# Patient Record
Sex: Female | Born: 1980 | Race: White | Hispanic: No | Smoking: Never smoker
Health system: Southern US, Community
[De-identification: ages and names within clinical notes are randomized; demographics above are authoritative.]

## PROBLEM LIST (undated history)

## (undated) DIAGNOSIS — K219 Gastro-esophageal reflux disease without esophagitis: Secondary | ICD-10-CM

## (undated) DIAGNOSIS — G43909 Migraine, unspecified, not intractable, without status migrainosus: Secondary | ICD-10-CM

## (undated) DIAGNOSIS — F411 Generalized anxiety disorder: Secondary | ICD-10-CM

## (undated) DIAGNOSIS — N200 Calculus of kidney: Secondary | ICD-10-CM

## (undated) DIAGNOSIS — F988 Other specified behavioral and emotional disorders with onset usually occurring in childhood and adolescence: Secondary | ICD-10-CM

## (undated) DIAGNOSIS — Z87442 Personal history of urinary calculi: Secondary | ICD-10-CM

## (undated) DIAGNOSIS — D649 Anemia, unspecified: Secondary | ICD-10-CM

## (undated) HISTORY — DX: Other specified behavioral and emotional disorders with onset usually occurring in childhood and adolescence: F98.8

## (undated) HISTORY — DX: Generalized anxiety disorder: F41.1

## (undated) HISTORY — DX: Migraine, unspecified, not intractable, without status migrainosus: G43.909

## (undated) HISTORY — PX: LITHOTRIPSY: SUR834

## (undated) HISTORY — DX: Gastro-esophageal reflux disease without esophagitis: K21.9

## (undated) HISTORY — PX: WISDOM TOOTH EXTRACTION: SHX21

## (undated) HISTORY — PX: CERVICAL CERCLAGE: SHX1329

---

## 1999-04-09 ENCOUNTER — Emergency Department (HOSPITAL_COMMUNITY): Admission: EM | Admit: 1999-04-09 | Discharge: 1999-04-10 | Payer: Self-pay | Admitting: Emergency Medicine

## 2001-05-15 ENCOUNTER — Encounter: Payer: Self-pay | Admitting: Family Medicine

## 2001-05-15 ENCOUNTER — Encounter: Admission: RE | Admit: 2001-05-15 | Discharge: 2001-05-15 | Payer: Self-pay | Admitting: Family Medicine

## 2001-12-06 ENCOUNTER — Encounter: Admission: RE | Admit: 2001-12-06 | Discharge: 2001-12-25 | Payer: Self-pay | Admitting: Obstetrics and Gynecology

## 2002-10-30 ENCOUNTER — Other Ambulatory Visit: Admission: RE | Admit: 2002-10-30 | Discharge: 2002-10-30 | Payer: Self-pay | Admitting: Gynecology

## 2003-01-20 ENCOUNTER — Inpatient Hospital Stay (HOSPITAL_COMMUNITY): Admission: AD | Admit: 2003-01-20 | Discharge: 2003-01-23 | Payer: Self-pay | Admitting: Gynecology

## 2003-06-02 ENCOUNTER — Encounter (INDEPENDENT_AMBULATORY_CARE_PROVIDER_SITE_OTHER): Payer: Self-pay | Admitting: Specialist

## 2003-06-02 ENCOUNTER — Inpatient Hospital Stay (HOSPITAL_COMMUNITY): Admission: AD | Admit: 2003-06-02 | Discharge: 2003-06-05 | Payer: Self-pay | Admitting: Gynecology

## 2003-08-04 ENCOUNTER — Other Ambulatory Visit: Admission: RE | Admit: 2003-08-04 | Discharge: 2003-08-04 | Payer: Self-pay | Admitting: Gynecology

## 2004-08-04 ENCOUNTER — Other Ambulatory Visit: Admission: RE | Admit: 2004-08-04 | Discharge: 2004-08-04 | Payer: Self-pay | Admitting: Addiction Medicine

## 2005-08-05 ENCOUNTER — Other Ambulatory Visit: Admission: RE | Admit: 2005-08-05 | Discharge: 2005-08-05 | Payer: Self-pay | Admitting: Obstetrics and Gynecology

## 2006-09-20 ENCOUNTER — Other Ambulatory Visit: Admission: RE | Admit: 2006-09-20 | Discharge: 2006-09-20 | Payer: Self-pay | Admitting: Obstetrics and Gynecology

## 2008-01-30 ENCOUNTER — Other Ambulatory Visit: Admission: RE | Admit: 2008-01-30 | Discharge: 2008-01-30 | Payer: Self-pay | Admitting: Obstetrics and Gynecology

## 2008-01-30 ENCOUNTER — Encounter: Payer: Self-pay | Admitting: Women's Health

## 2008-01-30 ENCOUNTER — Ambulatory Visit: Payer: Self-pay | Admitting: Women's Health

## 2009-04-08 ENCOUNTER — Ambulatory Visit: Payer: Self-pay | Admitting: Women's Health

## 2009-04-08 ENCOUNTER — Other Ambulatory Visit: Admission: RE | Admit: 2009-04-08 | Discharge: 2009-04-08 | Payer: Self-pay | Admitting: Obstetrics and Gynecology

## 2009-04-15 ENCOUNTER — Emergency Department (HOSPITAL_COMMUNITY): Admission: EM | Admit: 2009-04-15 | Discharge: 2009-04-15 | Payer: Self-pay | Admitting: Emergency Medicine

## 2009-05-27 ENCOUNTER — Ambulatory Visit: Payer: Self-pay | Admitting: Women's Health

## 2009-06-08 ENCOUNTER — Ambulatory Visit: Payer: Self-pay | Admitting: Women's Health

## 2009-07-27 ENCOUNTER — Ambulatory Visit (HOSPITAL_COMMUNITY): Admission: RE | Admit: 2009-07-27 | Discharge: 2009-07-27 | Payer: Self-pay | Admitting: Obstetrics and Gynecology

## 2009-12-09 ENCOUNTER — Inpatient Hospital Stay (HOSPITAL_COMMUNITY): Admission: AD | Admit: 2009-12-09 | Discharge: 2009-12-10 | Payer: Self-pay | Admitting: Obstetrics and Gynecology

## 2010-01-07 ENCOUNTER — Inpatient Hospital Stay (HOSPITAL_COMMUNITY): Admission: AD | Admit: 2010-01-07 | Discharge: 2010-01-07 | Payer: Self-pay | Admitting: Obstetrics and Gynecology

## 2010-01-21 ENCOUNTER — Inpatient Hospital Stay (HOSPITAL_COMMUNITY): Admission: AD | Admit: 2010-01-21 | Discharge: 2010-01-23 | Payer: Self-pay | Admitting: Obstetrics and Gynecology
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
## 2010-05-25 LAB — CBC
HCT: 26.3 % — ABNORMAL LOW (ref 36.0–46.0)
HCT: 29 % — ABNORMAL LOW (ref 36.0–46.0)
Hemoglobin: 9.1 g/dL — ABNORMAL LOW (ref 12.0–15.0)
MCV: 73.8 fL — ABNORMAL LOW (ref 78.0–100.0)
Platelets: 288 10*3/uL (ref 150–400)
RBC: 3.57 MIL/uL — ABNORMAL LOW (ref 3.87–5.11)
RDW: 17.8 % — ABNORMAL HIGH (ref 11.5–15.5)
RDW: 17.9 % — ABNORMAL HIGH (ref 11.5–15.5)
WBC: 10.6 10*3/uL — ABNORMAL HIGH (ref 4.0–10.5)
WBC: 12 10*3/uL — ABNORMAL HIGH (ref 4.0–10.5)

## 2010-05-25 LAB — RPR: RPR Ser Ql: NONREACTIVE

## 2010-05-27 LAB — CBC
Hemoglobin: 9.3 g/dL — ABNORMAL LOW (ref 12.0–15.0)
MCH: 25.3 pg — ABNORMAL LOW (ref 26.0–34.0)
MCHC: 33 g/dL (ref 30.0–36.0)
Platelets: 241 10*3/uL (ref 150–400)

## 2010-05-27 LAB — URINE CULTURE: Colony Count: NO GROWTH

## 2010-05-27 LAB — STREP B DNA PROBE: Strep Group B Ag: NEGATIVE

## 2010-05-27 LAB — RPR: RPR Ser Ql: NONREACTIVE

## 2010-05-31 LAB — CBC
HCT: 32 % — ABNORMAL LOW (ref 36.0–46.0)
Hemoglobin: 11.1 g/dL — ABNORMAL LOW (ref 12.0–15.0)
MCHC: 34.7 g/dL (ref 30.0–36.0)
MCV: 84 fL (ref 78.0–100.0)
RBC: 3.81 MIL/uL — ABNORMAL LOW (ref 3.87–5.11)
WBC: 6 10*3/uL (ref 4.0–10.5)

## 2010-07-30 NOTE — Discharge Summary (Signed)
NAMEBRITENY, FULGHUM                        ACCOUNT NO.:  [id number]   MEDICAL RECORD NO.:  [id number]                   PATIENT TYPE:  INP   LOCATION:  9153                                 FACILITY:  [facility]   PHYSICIAN:  Timothy P. Fontaine, M.D.           DATE OF BIRTH:  April 16, 1980   DATE OF ADMISSION:  01/20/2003  DATE OF DISCHARGE:  01/23/2003                                 DISCHARGE SUMMARY   DISCHARGE DIAGNOSES:  1. Intrauterine pregnancy 21+ weeks, undelivered.  2. Incompetent cervix.  3. McDonald type cervical cerclage placement by Timothy P. Fontaine, M.D. on     January 22, 2003.   HISTORY:  This is a 30 year old female gravida 3, para 0 whose prenatal  course was complicated by progressive cervical dilatation.  The patient was  evaluated November 5 with ultrasound to complete a second trimester  screening and was found to have a cervical length of 23 mm with funneling.  On prior ultrasound January 03, 2003 found to have a cervical length of 39  mm.  She was instructed as far as low level activity and repeat ultrasound  done three days later and found to have cervical length of 13 mm with  funneling.  The patient was admitted.   HOSPITAL COURSE:  On January 20, 2003 patient was admitted at 21+ weeks for  bed rest and further evaluation.  Was begun on IV Unasyn 1.5 g and  ultrasound was ordered for approximately 24 hours after the first ultrasound  in the office.  On January 21, 2003 cervical ultrasound showed cervical  length of 3.7 without funneling, vertex, no previa, AFI normal.  Secondary  to the diagnosis of incompetent cervix patient was planned for a cerclage.  On January 22, 2003 patient without complaint, good fetal movement, no  contraction, no discharge.  The patient was counseled and it was decided to  proceed forward with a McDonald type cervical cerclage placement by Timothy  P. Fontaine, M.D. on January 22, 2003 without complications.  On January 23, 2003 patient had a quiet night without contractions or bleeding.  Abdomen and uterus soft and nontender and patient was felt at that point in  satisfactory condition to be discharged to home.   DISCHARGE INSTRUCTIONS:  The patient was discharged to home.  Was placed on  bed rest.  Ampicillin 500 mg q.i.d. for seven days.  Just check sonogram  with office visit earlier the next week.     Susa Loffler, P.A.                    Timothy P. Audie Box, M.D.    TSG/MEDQ  D:  02/17/2003  T:  02/17/2003  Job:  [id number]

## 2010-07-30 NOTE — Consult Note (Signed)
   NAME:  Cassie English, Cassie English                        ACCOUNT NO.:  [id number]   MEDICAL RECORD NO.:  [id number]                   PATIENT TYPE:  INP   LOCATION:  9160                                 [facility]   PHYSICIAN:  Timothy P. Fontaine, M.D.           DATE OF BIRTH:  18-Mar-1980   DATE OF CONSULTATION:  DATE OF DISCHARGE:                                   CONSULTATION   HISTORY:  Ultrasound:  Cervical length this morning shows a length of 3.7 cm  without funneling.  The patient's prior length was 13 mm in the office.  I  reviewed with the patient, again, the situation.  It certainly is reassuring  that her cervical length appears normal on ultrasound after bed rest.  The  question is whether once she gets up and moves around a little more, say, at  home whether her cervix would again dilate to a point where she would then  have bulging membranes and ultimately lose the pregnancy.  I reviewed with  her the situation and the whole issue as to whether a prophylactic cerclage  would be of benefit.  I reviewed what is involved with a cerclage to include  the anesthesia and the placement of the suture.  The risks of immediate  complications to include rupture of membranes, hemorrhage, damage to  surrounding tissue such as vagina, bladder, rectum was discussed as well as  the longer term issues as far as infection and subsequent loss of the  pregnancy with rupture of membranes and the risk of achieving a periviable  pregnancy and delivering a severely premature infant with long-term  sequelae.  The options of expectant management with decreased activity and  bed rest versus placing the cerclage and again maintaining decreased  activity were offered and the patient feels comfortable with proceeding with  the cerclage.  I discussed the case with my partners, Ivor Costa. Farrel Gobble, M.D.  and Gaetano Hawthorne. Lily Peer, M.D. and they both agree that cerclage placement is  probably the most appropriate  given her total situation.  The patient  understands.  Again, there is no guarantees that cerclage will ultimately  prolong her pregnancy and she accepts this.  The patient will be scheduled  tomorrow and will proceed with cerclage placement.                                               Timothy P. Audie Box, M.D.    TPF/MEDQ  D:  01/21/2003  T:  01/21/2003  Job:  [id number]

## 2010-07-30 NOTE — H&P (Signed)
NAME:  Cassie English, Cassie English                        ACCOUNT NO.:  [id number]   MEDICAL RECORD NO.:  [id number]                   PATIENT TYPE:  INP   LOCATION:  9160                                 [facility]   PHYSICIAN:  Timothy P. Fontaine, M.D.           DATE OF BIRTH:  02-10-1981   DATE OF ADMISSION:  01/20/2003  DATE OF DISCHARGE:                                HISTORY & PHYSICAL   CHIEF COMPLAINT:  Progressive cervical dilatation.   HISTORY OF PRESENT ILLNESS:  30 year old G3, P0 female at [redacted] weeks gestation  who enters with a history of progressive cervical dilatation.  The patient  was evaluated January 17, 2003 with ultrasound to complete a second  trimester screening ultrasound and was found to have a cervical length of 23  mm with funneling.  On prior ultrasound January 03, 2003 she was found to  have a cervical length of 39 mm.  She was instructed as far as low level  activities and on repeat ultrasound three days later was found to have a  cervical length of 13 mm with funneling.  The patient is admitted at this  time for bed rest and further evaluation.   PAST HISTORY:  Includes an SAB and a TAB, and is otherwise uncomplicated  without history of cervical surgeries.   PRENATAL COURSE:  Has a negative cystic fibrosis screen.  A normal MSAFP.  A  negative GC and chlamydia screen.  For remainder of her history see her  [facility].   PHYSICAL EXAMINATION:  HEENT:  Normal.  LUNGS:  Clear.  CARDIAC:  Regular rate without rubs, murmurs or gallops.  ABDOMEN:  Exam benign with uterus appropriate for 21 weeks.  Positive fetal  heart tones.  PELVIC:  External BUS, vagina normal with normal whitish discharge; KOH wet  prep done which does show yeast thought negative BP or Trichomonas. Cervix  is shortened and closed.  Strep culture done and results are pending at this  time.   ASSESSMENT AND PLAN:  30 year old G3, P0, AB-2 female with shortened cervix  at 21 weeks.   History benign as far as surgical surgery; does have TAB x1  with an SAB x1.  The situation discussed with the patient and her husband  and the issues as to the best way to manage were reviewed.  Options for  expectant management with the risk that there will be silent progression of  her cervical dilatation and ultimate loss of the pregnancy or achieved peri-  viable or severely premature stage was reviewed versus the risks of  proceeding with a cervical cerclage at this time and the risk of either  immediate loss of the pregnancy due to a complication with the cerclage or a  delayed loss associated with the cerclage secondary to infection, rupture of  membranes, or preterm contractions was all discussed with them.  A strep  culture was done today.  Will start antibiotic  coverage with Unasyn 1.5  grams; bed rest, and will repeat ultrasound tomorrow after roughly 24 hours  of bed rest.  Will reassess at that time and decide whether we want to  proceed with cerclage, if so we will rest tomorrow and proceed the day after  to achieve maximum cervical  length prior to placing the cerclage.  Will further discuss after her follow  up ultrasound and develop the plan.  Wet prep was negative with the  exception of yeast and will hold on treatment at this time.  Her GC and  chlamydia screen early in pregnancy was negative.  The patient and her  husband understand the issues and agree with the plan.                                               Timothy P. Audie Box, M.D.    TPF/MEDQ  D:  01/20/2003  T:  01/21/2003  Job:  [id number]

## 2010-07-30 NOTE — Discharge Summary (Signed)
[name], Cassie English                        ACCOUNT NO.:  [id number]   MEDICAL RECORD NO.:  [id number]                   PATIENT TYPE:  INP   LOCATION:  9111                                 [facility]ILITY:  [facility]   PHYSICIAN:  Ivor Costa. Farrel Gobble, M.D.              DATE OF BIRTH:  1980/08/05   DATE OF ADMISSION:  06/02/2003  DATE OF DISCHARGE:  06/05/2003                                 DISCHARGE SUMMARY   DISCHARGE DIAGNOSES:  1. Intrauterine pregnancy at 40 weeks delivered.  2. Short cervix status post cerclage placement with removal at 37 weeks.  3. Status post forceps-assisted vaginal delivery.  4. Asymptomatic anemia.   HISTORY:  This is a 22-years-of-age female gravida 3 para 0 with an EDC of  May 29, 2003.  Prenatal course had been complicated by the patient being  found to have a short cervix and cerclage was placed, McDonald's type, at 21  weeks and was removed at 37 weeks.  Otherwise, prenatal course was  uncomplicated.   HOSPITAL COURSE:  On June 02, 2003 the patient admitted at 40 weeks with  advanced dilatation of 4 cm and the patient was admitted, artificial rupture  of membranes performed, and was noted to be dark, not particularly meconium.  On June 03, 2003 the patient underwent a forceps-assisted vaginal delivery  secondary to fetal heart rate with minimal variability and poor scalp  stimulation.  Underwent delivery of a female, Apgars of 8 and 9, weight of 8  pounds 12 ounces.  DeLee suction was performed with 2 mL of clear amniotic  fluid.  Pediatrics was in attendance.  There was a second degree midline  episiotomy which was repaired without complications.  Postpartum the patient  remained afebrile, voiding, in stable condition.  She was discharged to home  on June 06, 2003 and given [facility] Gy[facility] postpartum instructions  and postpartum booklet.   ACCESSORY CLINICAL FINDINGS/LABORATORY DATA:  The patient is A positive,  rubella immune.  On June 04, 2003  hemoglobin was 7.6.   DISPOSITION:  The patient was discharged to home, informed to return to the  office in 6 weeks, if had any problem prior to that time to be seen in the  office.     Susa Loffler, P.A.                    Ivor Costa. Farrel Gobble, M.D.    TSG/MEDQ  D:  06/23/2003  T:  06/23/2003  Job:  [id number]

## 2010-07-30 NOTE — Op Note (Signed)
   Cassie English, Cassie English                        ACCOUNT NO.:  [id number]   MEDICAL RECORD NO.:  [id number]                   PATIENT TYPE:  INP   LOCATION:  9153                                 [facility]   PHYSICIAN:  Timothy P. Fontaine, M.D.           DATE OF BIRTH:  08/28/1980   DATE OF PROCEDURE:  01/22/2003  DATE OF DISCHARGE:                                 OPERATIVE REPORT   PREOPERATIVE DIAGNOSES:  1. Pregnancy at 21-22 weeks' gestation.  2. Incompetent cervix.   POSTOPERATIVE DIAGNOSES:  1. Pregnancy at 21-22 weeks' gestation.  2. Incompetent cervix.   PROCEDURE:  McDonald-type cervical cerclage.   SURGEON:  Timothy P. Fontaine, M.D.   ANESTHESIA:  Spinal.   ESTIMATED BLOOD LOSS:  Minimal.   COMPLICATIONS:  None.   SPECIMENS:  None.   FINDINGS:  Cervix noted to be shortened and closed.  A 5-mm Mersilene band  placed, knot at 12 o'clock.   PROCEDURE:  The patient was taken to the operating room, underwent spinal  anesthesia, was placed in the dorsal lithotomy position, received a  perineal, vaginal preparation with Betadine solution.  The bladder emptied  with in-and-out Foley catheterization.  The patient was draped in the usual  fashion.  The cervix was visualized with surgical speculums, copiously  irrigated with normal saline.  The cervix cerclage was then placed using a 5-  mm Mersilene band starting at the 12 o'clock position with circumferential  placement of the pursestring-type suture with the knot tied at 12.  The knot  was tied to achieve support without strangulating the tissue.  The upper  vagina was again copiously irrigated with saline, good hemostasis was  visualized.  The speculums removed.  The patient placed in the supine  position and sent to the recovery room in good condition having tolerated  the procedure well.                                               Timothy P. Audie Box, M.D.    TPF/MEDQ  D:  01/22/2003  T:  01/23/2003   Job:  [id number]

## 2010-11-29 IMAGING — US US OB COMP +14 WK
1 series · 14 of 28 positions shown · non-contrast
Comparison: none

OBSTETRICAL ULTRASOUND:
 This ultrasound was performed in The [HOSPITAL], and the [facility] report will be stored to [REDACTED] PACS.  This report is also available in [HOSPITAL]?s accessANYware.

[Series 1: us ob comp +14 wk · 14 of 87 slices shown]
[im 4/87]
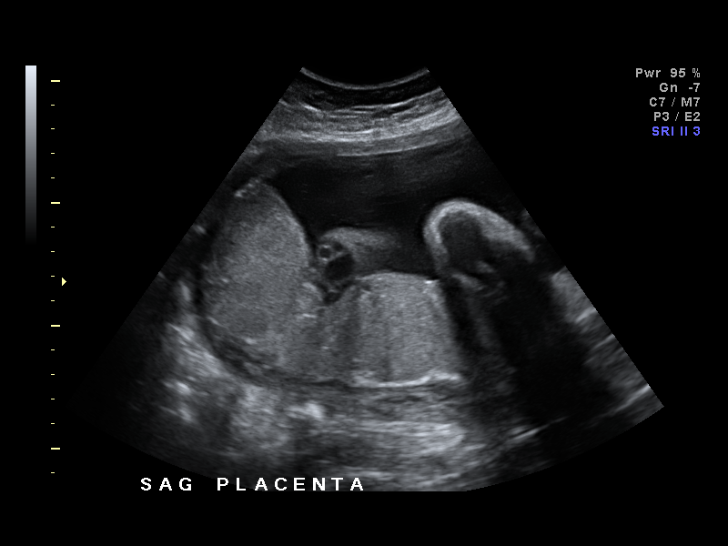
[im 10/87]
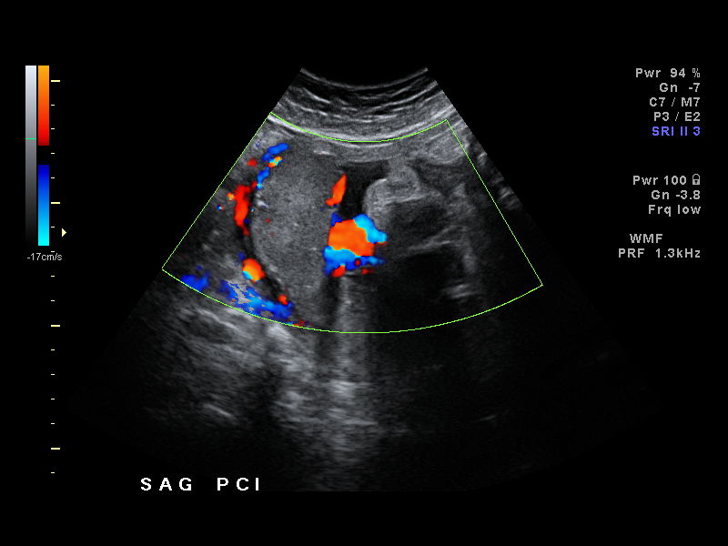
[im 16/87]
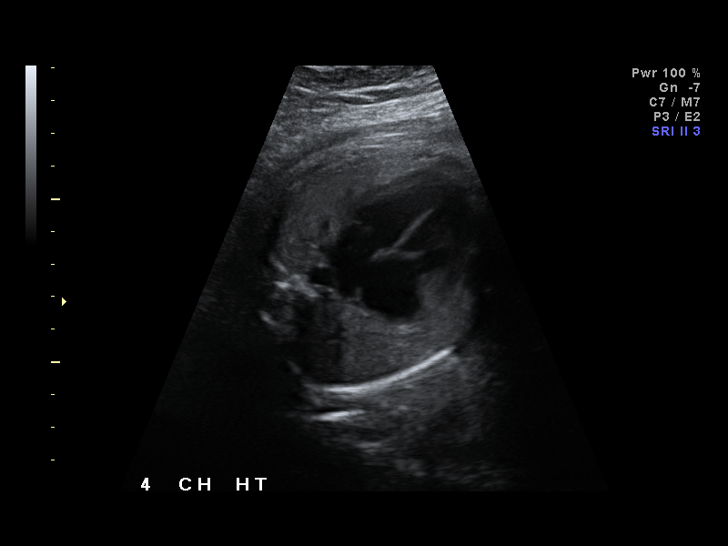
[im 23/87]
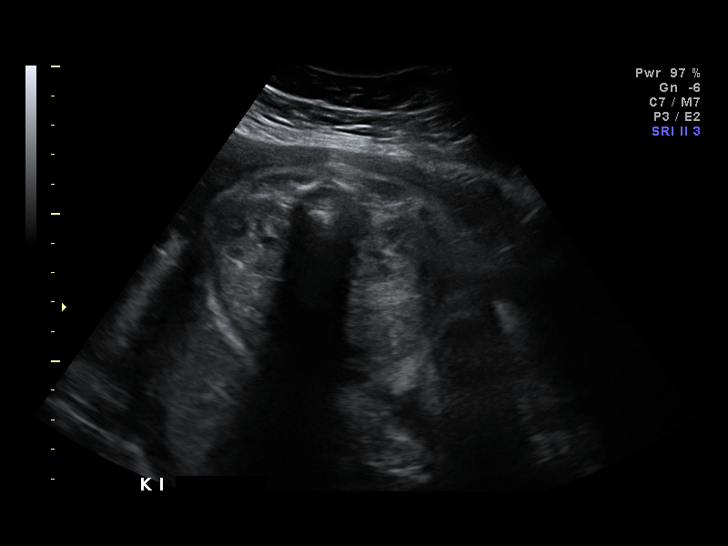
[im 29/87]
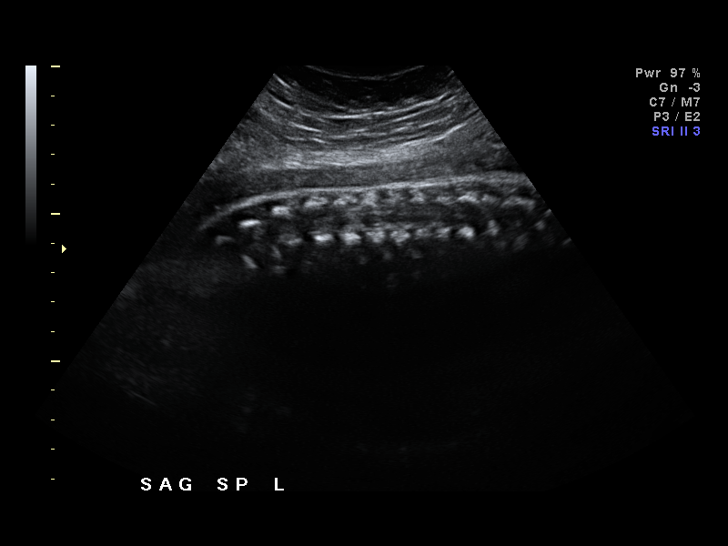
[im 36/87]
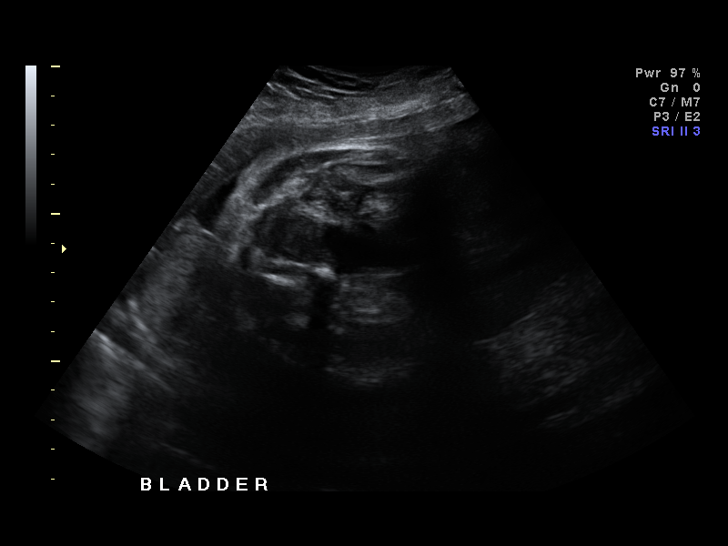
[im 42/87]
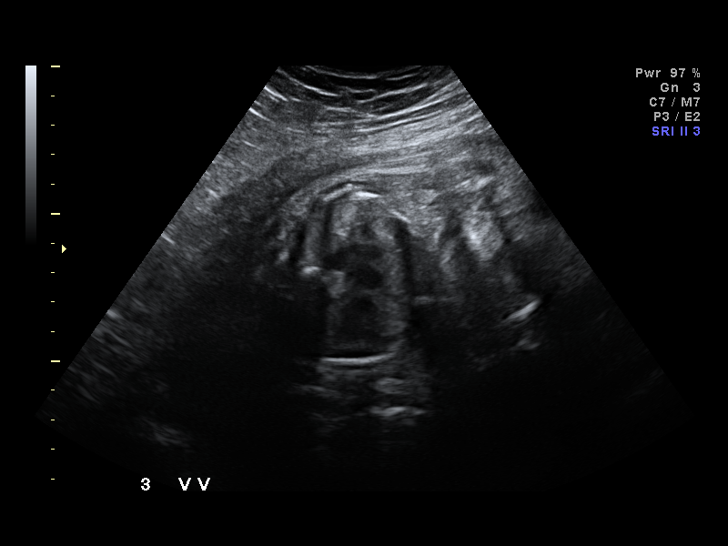
[im 48/87]
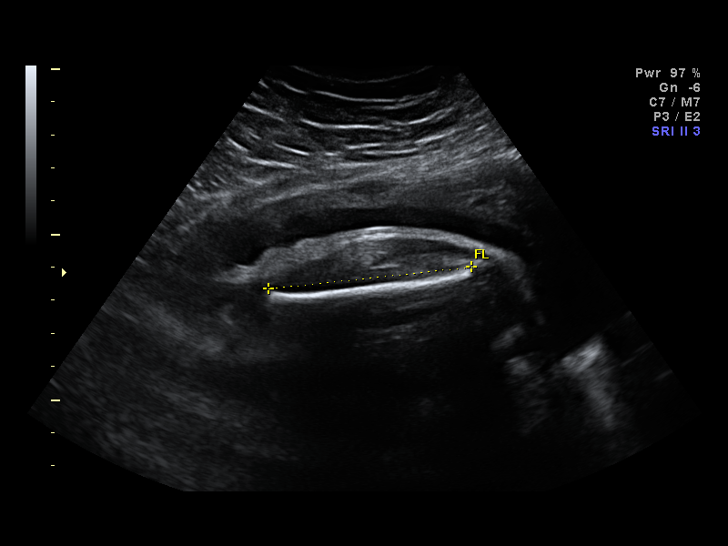
[im 55/87]
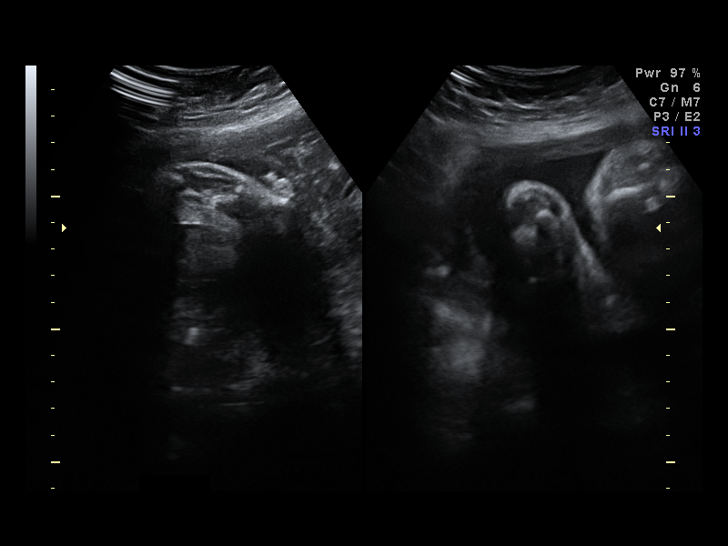
[im 61/87]
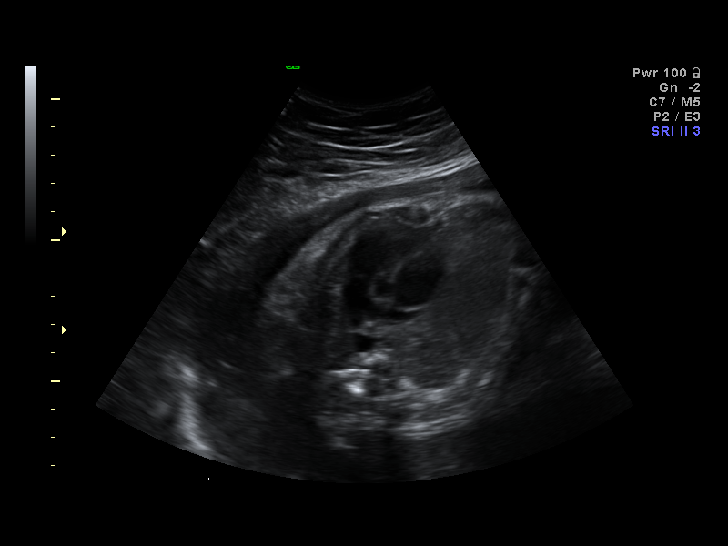
[im 67/87]
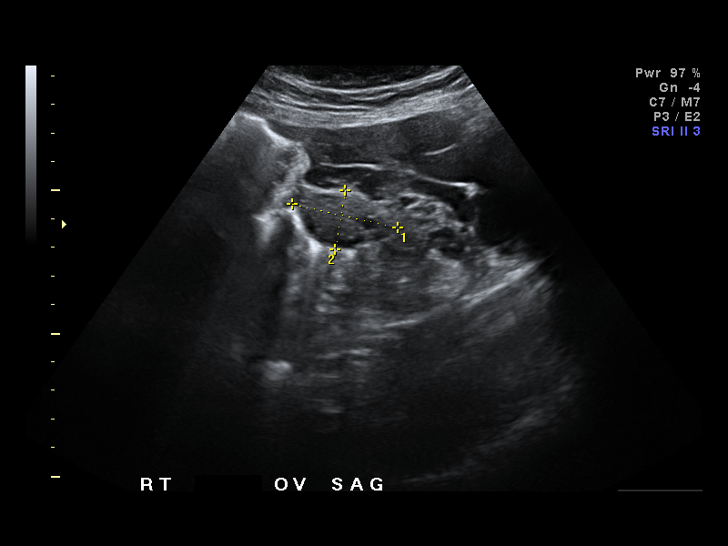
[im 74/87]
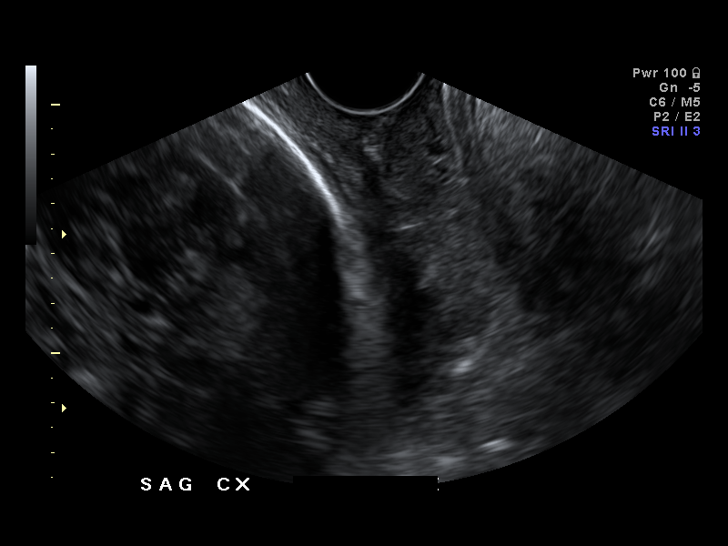
[im 80/87]
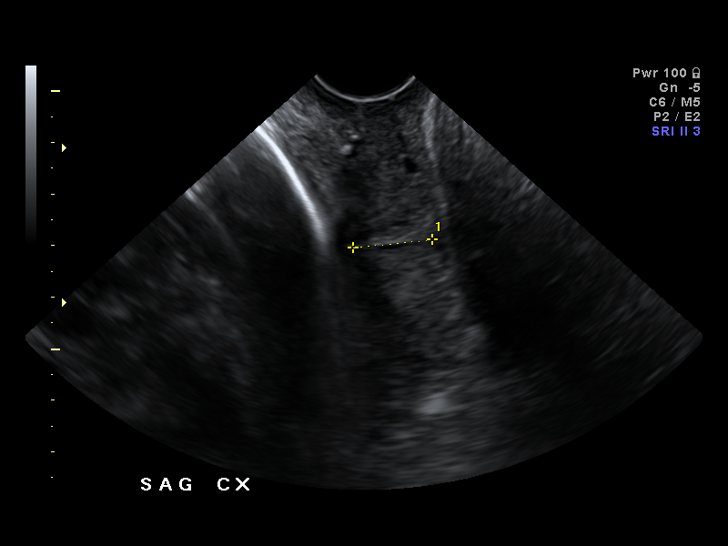
[im 87/87]
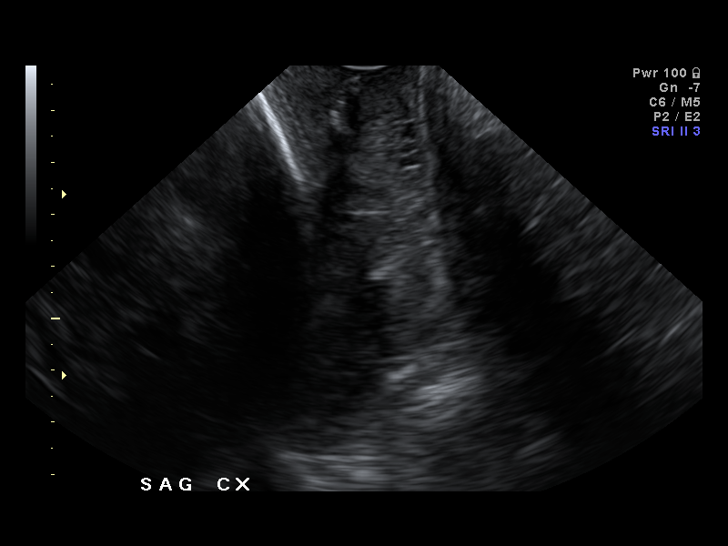

[14 of 28 positions shown; findings below may reference images not displayed]

IMPRESSION: [facility] has also been faxed to the ordering physician.

## 2012-05-14 ENCOUNTER — Encounter: Payer: Self-pay | Admitting: Women's Health

## 2012-05-23 ENCOUNTER — Other Ambulatory Visit (HOSPITAL_COMMUNITY)
Admission: RE | Admit: 2012-05-23 | Discharge: 2012-05-23 | Disposition: A | Discharge status: Home / Self Care | Payer: BC Managed Care – PPO | Source: Ambulatory Visit | Attending: Obstetrics and Gynecology | Admitting: Obstetrics and Gynecology

## 2012-05-23 ENCOUNTER — Encounter: Payer: Self-pay | Admitting: Women's Health

## 2012-05-23 ENCOUNTER — Ambulatory Visit (INDEPENDENT_AMBULATORY_CARE_PROVIDER_SITE_OTHER): Payer: BC Managed Care – PPO | Admitting: Women's Health

## 2012-05-23 VITALS — BP 110/66 | Ht 64.0 in | Wt 140.0 lb

## 2012-05-23 DIAGNOSIS — Z01419 Encounter for gynecological examination (general) (routine) without abnormal findings: Secondary | ICD-10-CM

## 2012-05-23 DIAGNOSIS — F411 Generalized anxiety disorder: Secondary | ICD-10-CM

## 2012-05-23 DIAGNOSIS — G43909 Migraine, unspecified, not intractable, without status migrainosus: Secondary | ICD-10-CM | POA: Insufficient documentation

## 2012-05-23 DIAGNOSIS — Z833 Family history of diabetes mellitus: Secondary | ICD-10-CM

## 2012-05-23 DIAGNOSIS — N898 Other specified noninflammatory disorders of vagina: Secondary | ICD-10-CM

## 2012-05-23 LAB — CBC WITH DIFFERENTIAL/PLATELET
Basophils Absolute: 0 10*3/uL (ref 0.0–0.1)
Eosinophils Absolute: 0 10*3/uL (ref 0.0–0.7)
Eosinophils Relative: 1 % (ref 0–5)
HCT: 38.5 % (ref 36.0–46.0)
Lymphocytes Relative: 31 % (ref 12–46)
Lymphs Abs: 1.2 10*3/uL (ref 0.7–4.0)
MCH: 26.5 pg (ref 26.0–34.0)
MCV: 81.1 fL (ref 78.0–100.0)
Monocytes Absolute: 0.4 10*3/uL (ref 0.1–1.0)
RDW: 13.8 % (ref 11.5–15.5)
WBC: 3.8 10*3/uL — ABNORMAL LOW (ref 4.0–10.5)

## 2012-05-23 LAB — WET PREP FOR TRICH, YEAST, CLUE
Trich, Wet Prep: NONE SEEN
Yeast Wet Prep HPF POC: NONE SEEN

## 2012-05-23 LAB — LIPID PANEL
HDL: 57 mg/dL (ref >39)
LDL Cholesterol: 106 mg/dL — ABNORMAL HIGH (ref 0–99)
Triglycerides: 59 mg/dL (ref <150)
VLDL: 12 mg/dL (ref 0–40)

## 2012-05-23 MED ORDER — FLUOXETINE HCL 10 MG PO CAPS: 10.0000 mg | ORAL_CAPSULE | Freq: Every day | ORAL | Status: DC

## 2012-05-23 MED ORDER — METRONIDAZOLE 0.75 % VA GEL: VAGINAL | Status: DC

## 2012-05-23 NOTE — Patient Instructions (Signed)

## 2012-05-23 NOTE — Progress Notes (Addendum)
Cordelia Bessinger Ferris 02-05-1981 [id number]    History:    The patient presents for annual exam.  Regular monthly cycles/condoms. Since last office visit delivered at [facility]. History of normal Paps. Anxiety taking Prozac 10 mg daily with good relief of symptoms.   Past medical history, past surgical history, family history and social history were all reviewed and documented in the EPIC chart. 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 Father diabetes   ROS:  A  ROS was performed and pertinent positives and negatives are included in the history.  Exam:  Filed Vitals:   05/23/12 0803  BP: 110/66    General appearance:  Normal Head/Neck:  Normal, without cervical or supraclavicular adenopathy. Thyroid:  Symmetrical, normal in size, without palpable masses or nodularity. Respiratory  Effort:  Normal  Auscultation:  Clear without wheezing or rhonchi Cardiovascular  Auscultation:  Regular rate, without rubs, murmurs or gallops  Edema/varicosities:  Not grossly evident Abdominal  Soft,nontender, without masses, guarding or rebound.  Liver/spleen:  No organomegaly noted  Hernia:  None appreciated  Skin  Inspection:  Grossly normal  Palpation:  Grossly normal Neurologic/psychiatric  Orientation:  Normal with appropriate conversation.  Mood/affect:  Normal  Genitourinary    Breasts: Examined lying and sitting.     Right: Without masses, retractions, discharge or axillary adenopathy.     Left: Without masses, retractions, discharge or axillary adenopathy.   Inguinal/mons:  Normal without inguinal adenopathy  External genitalia:  Normal  BUS/Urethra/Skene's glands:  Normal  Bladder:  Normal  Vagina:  Wet prep positive for amines, clues, many bacteria  Cervix:  Normal  Uterus:   normal in size, shape and contour.  Midline and mobile  Adnexa/parametria:     Rt: Without masses or tenderness.   Lt: Without masses or  tenderness.  Anus and perineum: Normal  Digital rectal exam: Normal sphincter tone without palpated masses or tenderness  Assessment/Plan:  32 y.o. M. WF G3 P2 for annual exam with complaint of discharge with occasional odor.  Bacteria vaginosis Anxiety-stable on Prozac Contraception counseling  Plan: MetroGel vaginal cream 1 applicator at bedtime x5, prescription, proper use, alcohol precautions reviewed. Prozac 10 mg by mouth daily prescription, proper use given and reviewed low-dose states is effective will continue. Denies need for counseling at this time. Contraception options reviewed, would like to try Mirena IUD, information given reviewed slight risk for perforation, hemorrhage infection. Will check coverage have Dr. Brayton Layman to place with next menstrual cycle. SBE's, regular exercise, calcium rich diet, vitamin D 1000 daily encouraged. CBC, glucose, lipid panel, UA, Pap. Pap normal 2009, new screening guidelines reviewed.    Harrington Challenger [facility], 1:33 PM 05/23/2012

## 2012-05-24 LAB — URINALYSIS W MICROSCOPIC + REFLEX CULTURE
Bacteria, UA: NONE SEEN
Bilirubin Urine: NEGATIVE
Casts: NONE SEEN
Glucose, UA: NEGATIVE mg/dL
Hgb urine dipstick: NEGATIVE
Ketones, ur: NEGATIVE mg/dL
Protein, ur: NEGATIVE mg/dL
pH: 6 (ref 5.0–8.0)

## 2012-05-25 LAB — URINE CULTURE: Colony Count: NO GROWTH

## 2012-05-28 ENCOUNTER — Other Ambulatory Visit: Payer: Self-pay | Admitting: Gynecology

## 2012-05-28 ENCOUNTER — Telehealth: Payer: Self-pay | Admitting: Gynecology

## 2012-05-28 DIAGNOSIS — Z3049 Encounter for surveillance of other contraceptives: Secondary | ICD-10-CM

## 2012-05-28 MED ORDER — LEVONORGESTREL 20 MCG/24HR IU IUD: INTRAUTERINE_SYSTEM | Freq: Once | INTRAUTERINE | Status: DC

## 2012-05-28 NOTE — Telephone Encounter (Signed)
Pt was informed today that her [facility] insurance covers the Mirena & insertion at 100%,no copay. I also reminded her that she has a preexisting clause on her policy until 10/02/12. Seeing as this is just for contraception it not a medical problem she decided to proceed and is on cycle now. We will call Tues 05/29/12 to schedule/WL

## 2012-06-01 ENCOUNTER — Ambulatory Visit: Payer: Self-pay | Admitting: Gynecology

## 2012-06-20 ENCOUNTER — Encounter: Payer: Self-pay | Admitting: Gynecology

## 2012-06-20 ENCOUNTER — Ambulatory Visit (INDEPENDENT_AMBULATORY_CARE_PROVIDER_SITE_OTHER): Payer: BC Managed Care – PPO | Admitting: Gynecology

## 2012-06-20 VITALS — BP 124/80

## 2012-06-20 DIAGNOSIS — N949 Unspecified condition associated with female genital organs and menstrual cycle: Secondary | ICD-10-CM

## 2012-06-20 DIAGNOSIS — A499 Bacterial infection, unspecified: Secondary | ICD-10-CM

## 2012-06-20 DIAGNOSIS — N898 Other specified noninflammatory disorders of vagina: Secondary | ICD-10-CM

## 2012-06-20 DIAGNOSIS — N760 Acute vaginitis: Secondary | ICD-10-CM

## 2012-06-20 MED ORDER — METRONIDAZOLE 500 MG PO TABS: 500.0000 mg | ORAL_TABLET | Freq: Two times a day (BID) | ORAL | Status: DC

## 2012-06-20 NOTE — Progress Notes (Signed)
Patient presented to the office today for placement of the Mirena IUD. She had her gynecological examination 05/23/2012 and was treated for bacterial vaginosis. She's currently on her menses but stated she was having some discharge with odor. Patient previously been provided with literature information the Mirena IUD.  Exam: Bartholin urethra Skene was within normal limits Vagina: Menstrual blood with some clear fluid was noted Cervix: As described above: Bimanual exam: Uterus anteverted normal size shape and consistency Adnexa: No palpable masses or tenderness Rectal exam not done  Wet prep: Amine positive, moderate clue cell, few WBC, too numerous to count bacteria  Assessment for/plan: Patient will be treated for bacterial vaginosis with Flagyl 500 mg one by mouth twice a day for 5 days and then return to the office to place a Mirena IUD

## 2012-06-20 NOTE — Patient Instructions (Signed)
Bacterial Vaginosis Bacterial vaginosis (BV) is a vaginal infection where the normal balance of bacteria in the vagina is disrupted. The normal balance is then replaced by an overgrowth of certain bacteria. There are several different kinds of bacteria that can cause BV. BV is the most common vaginal infection in women of childbearing age. CAUSES   The cause of BV is not fully understood. BV develops when there is an increase or imbalance of harmful bacteria.  Some activities or behaviors can upset the normal balance of bacteria in the vagina and put women at increased risk including:  Having a new sex partner or multiple sex partners.  Douching.  Using an intrauterine device (IUD) for contraception.  It is not clear what role sexual activity plays in the development of BV. However, women that have never had sexual intercourse are rarely infected with BV. Women do not get BV from toilet seats, bedding, swimming pools or from touching objects around them.  SYMPTOMS   Grey vaginal discharge.  A fish-like odor with discharge, especially after sexual intercourse.  Itching or burning of the vagina and vulva.  Burning or pain with urination.  Some women have no signs or symptoms at all. DIAGNOSIS  Your caregiver must examine the vagina for signs of BV. Your caregiver will perform lab tests and look at the sample of vaginal fluid through a microscope. They will look for bacteria and abnormal cells (clue cells), a pH test higher than 4.5, and a positive amine test all associated with BV.  RISKS AND COMPLICATIONS   Pelvic inflammatory disease (PID).  Infections following gynecology surgery.  Developing HIV.  Developing herpes virus. TREATMENT  Sometimes BV will clear up without treatment. However, all women with symptoms of BV should be treated to avoid complications, especially if gynecology surgery is planned. Female partners generally do not need to be treated. However, BV may spread  between female sex partners so treatment is helpful in preventing a recurrence of BV.   BV may be treated with antibiotics. The antibiotics come in either pill or vaginal cream forms. Either can be used with nonpregnant or pregnant women, but the recommended dosages differ. These antibiotics are not harmful to the baby.  BV can recur after treatment. If this happens, a second round of antibiotics will often be prescribed.  Treatment is important for pregnant women. If not treated, BV can cause a premature delivery, especially for a pregnant woman who had a premature birth in the past. All pregnant women who have symptoms of BV should be checked and treated.  For chronic reoccurrence of BV, treatment with a type of prescribed gel vaginally twice a week is helpful. HOME CARE INSTRUCTIONS   Finish all medication as directed by your caregiver.  Do not have sex until treatment is completed.  Tell your sexual partner that you have a vaginal infection. They should see their caregiver and be treated if they have problems, such as a mild rash or itching.  Practice safe sex. Use condoms. Only have 1 sex partner. PREVENTION  Basic prevention steps can help reduce the risk of upsetting the natural balance of bacteria in the vagina and developing BV:  Do not have sexual intercourse (be abstinent).  Do not douche.  Use all of the medicine prescribed for treatment of BV, even if the signs and symptoms go away.  Tell your sex partner if you have BV. That way, they can be treated, if needed, to prevent reoccurrence. SEEK MEDICAL CARE IF:     Your symptoms are not improving after 3 days of treatment.  You have increased discharge, pain, or fever. MAKE SURE YOU:   Understand these instructions.  Will watch your condition.  Will get help right away if you are not doing well or get worse. FOR MORE INFORMATION  Division of STD Prevention (DSTDP), Centers for Disease Control and Prevention:  www.cdc.gov/std American [employer] Association (ASHA): www.ashastd.org  Document Released: 02/28/2005 Document Revised: 05/23/2011 Document Reviewed: 08/21/2008 Exit[employer] Patient Information 2013 [employer].  

## 2012-06-25 ENCOUNTER — Encounter: Payer: Self-pay | Admitting: Gynecology

## 2012-06-25 ENCOUNTER — Ambulatory Visit (INDEPENDENT_AMBULATORY_CARE_PROVIDER_SITE_OTHER): Payer: BC Managed Care – PPO | Admitting: Gynecology

## 2012-06-25 VITALS — BP 120/78

## 2012-06-25 DIAGNOSIS — Z30430 Encounter for insertion of intrauterine contraceptive device: Secondary | ICD-10-CM

## 2012-06-25 NOTE — Patient Instructions (Addendum)
Intrauterine Device Information  An intrauterine device (IUD) is inserted into your uterus and prevents pregnancy. There are 2 types of IUDs available:  · Copper IUD. This type of IUD is wrapped in copper wire and is placed inside the uterus. Copper makes the uterus and fallopian tubes produce a fluid that kills sperm. The copper IUD can stay in place for 10 years.  · Hormone IUD. This type of IUD contains the hormone progestin (synthetic progesterone). The hormone thickens the cervical mucus and prevents sperm from entering the uterus, and it also thins the uterine lining to prevent implantation of a fertilized egg. The hormone can weaken or kill the sperm that get into the uterus. The hormone IUD can stay in place for 5 years.  Your caregiver will make sure you are a good candidate for a contraceptive IUD. Discuss with your caregiver the possible side effects.  ADVANTAGES  · It is highly effective, reversible, long-acting, and low maintenance.  · There are no estrogen-related side effects.  · An IUD can be used when breastfeeding.  · It is not associated with weight gain.  · It works immediately after insertion.  · The copper IUD does not interfere with your female hormones.  · The progesterone IUD can make heavy menstrual periods lighter.  · The progesterone IUD can be used for 5 years.  · The copper IUD can be used for 10 years.  DISADVANTAGES  · The progesterone IUD can be associated with irregular bleeding patterns.  · The copper IUD can make your menstrual flow heavier and more painful.  · You may experience cramping and vaginal bleeding after insertion.  Document Released: 02/02/2004 Document Revised: 05/23/2011 Document Reviewed: 07/03/2010  ExitCare® Patient Information ©2013 [employer].

## 2012-06-25 NOTE — Progress Notes (Signed)
IUD procedure note       Patient presented to the office today for placement of Mirena IUD. The patient had previously been provided with literature information on this method of contraception. The risks benefits and pros and cons were discussed and all her questions were answered. She is fully aware that this form of contraception is 99% effective and is good for 5 years.  Pelvic exam: Bartholin urethra Skene glands: Within normal limits Vagina: No lesions or discharge Cervix: No lesions or discharge Uterus: retroverted position Adnexa: No masses or tenderness Rectal exam: Not done  The cervix was cleansed with Betadine solution. A single-tooth tenaculum was placed on the anterior cervical lip. The uterus sounded to 7-1/2 centimeter. The IUD was shown to the patient and inserted in a sterile fashion. The IUD string was trimmed. The single-tooth tenaculum was removed. Patient was instructed to return back to the office in one month for follow up.     Lot #[id number]

## 2012-06-26 ENCOUNTER — Encounter: Payer: Self-pay | Admitting: Gynecology

## 2012-06-26 ENCOUNTER — Ambulatory Visit: Payer: Self-pay | Admitting: Gynecology

## 2012-07-25 ENCOUNTER — Ambulatory Visit: Payer: BC Managed Care – PPO | Admitting: Gynecology

## 2012-08-02 ENCOUNTER — Ambulatory Visit: Payer: BC Managed Care – PPO | Admitting: Gynecology

## 2012-08-21 ENCOUNTER — Ambulatory Visit: Payer: BC Managed Care – PPO | Admitting: Gynecology

## 2013-08-07 ENCOUNTER — Other Ambulatory Visit: Payer: Self-pay | Admitting: Women's Health

## 2014-01-10 ENCOUNTER — Ambulatory Visit (INDEPENDENT_AMBULATORY_CARE_PROVIDER_SITE_OTHER): Payer: BC Managed Care – PPO | Admitting: Women's Health

## 2014-01-10 ENCOUNTER — Encounter: Payer: Self-pay | Admitting: Women's Health

## 2014-01-10 VITALS — BP 120/84 | Ht 64.0 in | Wt 176.0 lb

## 2014-01-10 DIAGNOSIS — R350 Frequency of micturition: Secondary | ICD-10-CM

## 2014-01-10 DIAGNOSIS — E282 Polycystic ovarian syndrome: Secondary | ICD-10-CM

## 2014-01-10 MED ORDER — METFORMIN HCL 500 MG PO TABS: 500.0000 mg | ORAL_TABLET | Freq: Two times a day (BID) | ORAL | Status: DC

## 2014-01-10 NOTE — Patient Instructions (Signed)
Polycystic Ovarian Syndrome  Polycystic ovarian syndrome (PCOS) is a common hormonal disorder among women of reproductive age. Most women with PCOS grow many small cysts on their ovaries. PCOS can cause problems with your periods and make it difficult to get pregnant. It can also cause an increased risk of miscarriage with pregnancy. If left untreated, PCOS can lead to serious health problems, such as diabetes and heart disease.  CAUSES  The cause of PCOS is not fully understood, but genetics may be a factor.  SIGNS AND SYMPTOMS    Infrequent or no menstrual periods.    Inability to get pregnant (infertility) because of not ovulating.    Increased growth of hair on the face, chest, stomach, back, thumbs, thighs, or toes.    Acne, oily skin, or dandruff.    Pelvic pain.    Weight gain or obesity, usually carrying extra weight around the waist.    Type 2 diabetes.    High cholesterol.    High blood pressure.    Female-pattern baldness or thinning hair.    Patches of thickened and dark brown or black skin on the neck, arms, breasts, or thighs.    Tiny excess flaps of skin (skin tags) in the armpits or neck area.    Excessive snoring and having breathing stop at times while asleep (sleep apnea).    Deepening of the voice.    Gestational diabetes when pregnant.   DIAGNOSIS   There is no single test to diagnose PCOS.    Your health care provider will:    Take a medical history.    Perform a pelvic exam.    Have ultrasonography done.    Check your female and female hormone levels.    Measure glucose or sugar levels in the blood.    Do other blood tests.    If you are producing too many female hormones, your health care provider will make sure it is from PCOS. At the physical exam, your health care provider will want to evaluate the areas of increased hair growth. Try to allow natural hair growth for a few days before the visit.    During a pelvic exam, the ovaries may be  enlarged or swollen because of the increased number of small cysts. This can be seen more easily by using vaginal ultrasonography or screening to examine the ovaries and lining of the uterus (endometrium) for cysts. The uterine lining may become thicker if you have not been having a regular period.   TREATMENT   Because there is no cure for PCOS, it needs to be managed to prevent problems. Treatments are based on your symptoms. Treatment is also based on whether you want to have a baby or whether you need contraception.   Treatment may include:    Progesterone hormone to start a menstrual period.    Birth control pills to make you have regular menstrual periods.    Medicines to make you ovulate, if you want to get pregnant.    Medicines to control your insulin.    Medicine to control your blood pressure.    Medicine and diet to control your high cholesterol and triglycerides in your blood.   Medicine to reduce excessive hair growth.   Surgery, making small holes in the ovary, to decrease the amount of female hormone production. This is done through a long, lighted tube (laparoscope) placed into the pelvis through a tiny incision in the lower abdomen.   HOME CARE INSTRUCTIONS   Only   take over-the-counter or prescription medicine as directed by your health care provider.   Pay attention to the foods you eat and your activity levels. This can help reduce the effects of PCOS.   Keep your weight under control.   Eat foods that are low in carbohydrate and high in fiber.   Exercise regularly.  SEEK MEDICAL CARE IF:   Your symptoms do not get better with medicine.   You have new symptoms.  Document Released: 06/24/2004 Document Revised: 12/19/2012 Document Reviewed: 08/16/2012  ExitCare Patient Information 2015 [employer]. This information is not intended to replace advice given to you by your health care provider. Make sure you discuss any questions you have with your health care provider.

## 2014-01-10 NOTE — Progress Notes (Signed)
Presents with three month history of increase acne on face, hair on abdomen, and 35lb weight gain. Weight 140 05/2012, weight now 176. Diagnosed >10 years ago with PCOS when trying to conceive first child.  Was put on Metformin at that time and did well. 2014 Mirena IUD placed, light spotting occasionally. Also complains of increased thirst and urinary frequency over past week. Denies dysuria/hematuria, vaginal discharge/odor.  Saw Dr. Cherly Hensenousins a few months ago with TV US and reports there were no signs of PCOS on ovaries at this time. Reports home and work life good.  Exam: Well appearing. Mild acne,  truncal obesity. Tearful when talking about weight and changes. UA: Unable to produce urine sample.   PCOS Truncal Obesity  Plan: Metformin 500 mg PO once daily for several weeks, and then increase to 500mg  BID. TSH and blood glucose pending. Counseled to call office if symptoms don't improve. Reviewed low carb diet and health eating habits, Weight Watchers encouraged.

## 2014-01-11 LAB — GLUCOSE, RANDOM: Glucose, Bld: 78 mg/dL (ref 70–99)

## 2014-01-11 LAB — TSH: TSH: 1.134 u[IU]/mL (ref 0.350–4.500)

## 2014-01-13 ENCOUNTER — Encounter: Payer: Self-pay | Admitting: Women's Health

## 2014-05-06 ENCOUNTER — Other Ambulatory Visit: Payer: Self-pay | Admitting: Women's Health

## 2014-05-21 ENCOUNTER — Other Ambulatory Visit: Payer: Self-pay | Admitting: Women's Health

## 2014-05-29 ENCOUNTER — Other Ambulatory Visit (HOSPITAL_COMMUNITY)
Admission: RE | Admit: 2014-05-29 | Discharge: 2014-05-29 | Disposition: A | Discharge status: Home / Self Care | Payer: BLUE CROSS/BLUE SHIELD | Source: Ambulatory Visit | Attending: Women's Health | Admitting: Women's Health

## 2014-05-29 ENCOUNTER — Ambulatory Visit (INDEPENDENT_AMBULATORY_CARE_PROVIDER_SITE_OTHER): Payer: BLUE CROSS/BLUE SHIELD | Admitting: Women's Health

## 2014-05-29 ENCOUNTER — Encounter: Payer: Self-pay | Admitting: Women's Health

## 2014-05-29 VITALS — BP 115/80 | Ht 64.0 in | Wt 157.0 lb

## 2014-05-29 DIAGNOSIS — A499 Bacterial infection, unspecified: Secondary | ICD-10-CM | POA: Diagnosis not present

## 2014-05-29 DIAGNOSIS — Z13220 Encounter for screening for lipoid disorders: Secondary | ICD-10-CM

## 2014-05-29 DIAGNOSIS — Z1151 Encounter for screening for human papillomavirus (HPV): Secondary | ICD-10-CM | POA: Diagnosis present

## 2014-05-29 DIAGNOSIS — F329 Major depressive disorder, single episode, unspecified: Secondary | ICD-10-CM | POA: Diagnosis not present

## 2014-05-29 DIAGNOSIS — Z01419 Encounter for gynecological examination (general) (routine) without abnormal findings: Secondary | ICD-10-CM

## 2014-05-29 DIAGNOSIS — Z833 Family history of diabetes mellitus: Secondary | ICD-10-CM | POA: Diagnosis not present

## 2014-05-29 DIAGNOSIS — Z975 Presence of (intrauterine) contraceptive device: Secondary | ICD-10-CM | POA: Insufficient documentation

## 2014-05-29 DIAGNOSIS — B9689 Other specified bacterial agents as the cause of diseases classified elsewhere: Secondary | ICD-10-CM

## 2014-05-29 DIAGNOSIS — F32A Depression, unspecified: Secondary | ICD-10-CM

## 2014-05-29 DIAGNOSIS — R350 Frequency of micturition: Secondary | ICD-10-CM | POA: Diagnosis not present

## 2014-05-29 DIAGNOSIS — N760 Acute vaginitis: Secondary | ICD-10-CM | POA: Diagnosis not present

## 2014-05-29 DIAGNOSIS — N898 Other specified noninflammatory disorders of vagina: Secondary | ICD-10-CM

## 2014-05-29 LAB — URINALYSIS W MICROSCOPIC + REFLEX CULTURE
Crystals: NONE SEEN
GLUCOSE, UA: NEGATIVE mg/dL
Nitrite: NEGATIVE
Urobilinogen, UA: 1 mg/dL (ref 0.0–1.0)
pH: 5 (ref 5.0–8.0)

## 2014-05-29 LAB — CBC WITH DIFFERENTIAL/PLATELET
Basophils Absolute: 0 10*3/uL (ref 0.0–0.1)
Basophils Relative: 0 % (ref 0–1)
EOS ABS: 0.1 10*3/uL (ref 0.0–0.7)
Eosinophils Relative: 1 % (ref 0–5)
HCT: 41.8 % (ref 36.0–46.0)
Hemoglobin: 13.5 g/dL (ref 12.0–15.0)
LYMPHS PCT: 23 % (ref 12–46)
Lymphs Abs: 1.2 10*3/uL (ref 0.7–4.0)
MCH: 26.8 pg (ref 26.0–34.0)
MCHC: 32.3 g/dL (ref 30.0–36.0)
MCV: 83.1 fL (ref 78.0–100.0)
MPV: 8.7 fL (ref 8.6–12.4)
Monocytes Absolute: 0.7 10*3/uL (ref 0.1–1.0)
Monocytes Relative: 13 % — ABNORMAL HIGH (ref 3–12)
NEUTROS ABS: 3.2 10*3/uL (ref 1.7–7.7)
Neutrophils Relative %: 63 % (ref 43–77)
Platelets: 269 10*3/uL (ref 150–400)
RBC: 5.03 MIL/uL (ref 3.87–5.11)
RDW: 15.2 % (ref 11.5–15.5)
WBC: 5.1 10*3/uL (ref 4.0–10.5)

## 2014-05-29 LAB — LIPID PANEL
CHOL/HDL RATIO: 4.4 ratio
Cholesterol: 201 mg/dL — ABNORMAL HIGH (ref 0–200)
HDL: 46 mg/dL (ref >=46)
LDL Cholesterol: 133 mg/dL — ABNORMAL HIGH (ref 0–99)
Triglycerides: 109 mg/dL (ref <150)
VLDL: 22 mg/dL (ref 0–40)

## 2014-05-29 LAB — WET PREP FOR TRICH, YEAST, CLUE
Trich, Wet Prep: NONE SEEN
YEAST WET PREP: NONE SEEN

## 2014-05-29 LAB — GLUCOSE, RANDOM: Glucose, Bld: 85 mg/dL (ref 70–99)

## 2014-05-29 MED ORDER — METRONIDAZOLE 0.75 % VA GEL: VAGINAL | Status: DC

## 2014-05-29 MED ORDER — FLUOXETINE HCL 20 MG PO TABS: 20.0000 mg | ORAL_TABLET | Freq: Every day | ORAL | Status: DC

## 2014-05-29 NOTE — Progress Notes (Signed)
Cassie PeoplesHeather N English 06/19/1980 [id number]    History:    Presents for annual exam.  Rare bleeding on Mirena IUD placed 06/2012. Normal Pap history. Prozac daily, PMS type syptoms. Currently separated from husband denies infidelity. Tearful most of exam.  Past medical history, past surgical history, family history and social history were all reviewed and documented in the EPIC chart. 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 Cassie English 9, Cassie English 2. Cassie English diabetes.   ROS:  A ROS was performed and pertinent positives and negatives are included.  Exam:  Filed Vitals:   05/29/14 1021  BP: 115/80    General appearance:  Normal Thyroid:  Symmetrical, normal in size, without palpable masses or nodularity. Respiratory  Auscultation:  Clear without wheezing or rhonchi Cardiovascular  Auscultation:  Regular rate, without rubs, murmurs or gallops  Edema/varicosities:  Not grossly evident Abdominal  Soft,nontender, without masses, guarding or rebound.  Liver/spleen:  No organomegaly noted  Hernia:  None appreciated  Skin  Inspection:  Grossly normal   Breasts: Examined lying and sitting.     Right: Without masses, retractions, discharge or axillary adenopathy.     Left: Without masses, retractions, discharge or axillary adenopathy. Gentitourinary   Inguinal/mons:  Normal without inguinal adenopathy  External genitalia:  Normal  BUS/Urethra/Skene's glands:  Normal  Vagina:  Moderate white discharge wet prep positive for amines, clues, TNTC bacteria  Cervix:  Normal IUD strings visible  Uterus:   normal in size, shape and contour.  Midline and mobile  Adnexa/parametria:     Rt: Without masses or tenderness.   Lt: Without masses or tenderness.  Anus and perineum: Normal  Digital rectal exam: Normal sphincter tone without palpated masses or tenderness  Assessment/Plan:  34 y.o. M WF G4 P2 for annual exam with complaint of urinary frequency and vaginal discharge.    Situational stress/marital  problems Rare spotting Mirena IUD 06/2012 Bacteria vaginosis  Plan: Strongly encouraged counseling, Cassie PandyJulie English's name and number given, instructed to schedule. SBE's, exercise, calcium rich diet, MVI daily encouraged. MetroGel vaginal cream 1 applicator at bedtime 5, alcohol precautions reviewed, instructed to call if no relief of discharge. CBC, glucose, lipid panel, UA, GC/Chlamydia, Pap with HR HPV typing, new screening guidelines reviewed. Denies need for STD screen.  UA, 3-6 WBCs, 7-10 RBCs, many bacteria, small leukocytes - urine culture pending.   Cassie ChallengerYOUNG,Cassie English Cassie English, 1:12 PM 05/29/2014

## 2014-05-29 NOTE — Patient Instructions (Addendum)
Health Maintenance Adopting a healthy lifestyle and getting preventive care can go a long way to promote health and wellness. Talk with your health care provider about what schedule of regular examinations is right for you. This is a good chance for you to check in with your provider about disease prevention and staying healthy. In between checkups, there are plenty of things you can do on your own. Experts have done a lot of research about which lifestyle changes and preventive measures are most likely to keep you healthy. Ask your health care provider for more information. WEIGHT AND DIET  Eat a healthy diet  Be sure to include plenty of vegetables, fruits, low-fat dairy products, and lean protein.  Do not eat a lot of foods high in solid fats, added sugars, or salt.  Get regular exercise. This is one of the most important things you can do for your health.  Most adults should exercise for at least 150 minutes each week. The exercise should increase your heart rate and make you sweat (moderate-intensity exercise).  Most adults should also do strengthening exercises at least twice a week. This is in addition to the moderate-intensity exercise.  Maintain a healthy weight  Body mass index (BMI) is a measurement that can be used to identify possible weight problems. It estimates body fat based on height and weight. Your health care provider can help determine your BMI and help you achieve or maintain a healthy weight.  For females 25 years of age and older:   A BMI below 18.5 is considered underweight.  A BMI of 18.5 to 24.9 is normal.  A BMI of 25 to 29.9 is considered overweight.  A BMI of 30 and above is considered obese.  Watch levels of cholesterol and blood lipids  You should start having your blood tested for lipids and cholesterol at 36 years of age, then have this test every 5 years.  You may need to have your cholesterol levels checked more often if:  Your lipid or  cholesterol levels are high.  You are older than 36 years of age.  You are at high risk for heart disease.  CANCER SCREENING   Lung Cancer  Lung cancer screening is recommended for adults 97-92 years old who are at high risk for lung cancer because of a history of smoking.  A yearly low-dose CT scan of the lungs is recommended for people who:  Currently smoke.  Have quit within the past 15 years.  Have at least a 30-pack-year history of smoking. A pack year is smoking an average of one pack of cigarettes a day for 1 year.  Yearly screening should continue until it has been 15 years since you quit.  Yearly screening should stop if you develop a health problem that would prevent you from having lung cancer treatment.  Breast Cancer  Practice breast self-awareness. This means understanding how your breasts normally appear and feel.  It also means doing regular breast self-exams. Let your health care provider know about any changes, no matter how small.  If you are in your 20s or 30s, you should have a clinical breast exam (CBE) by a health care provider every 1-3 years as part of a regular health exam.  If you are 76 or older, have a CBE every year. Also consider having a breast X-ray (mammogram) every year.  If you have a family history of breast cancer, talk to your health care provider about genetic screening.  If you are  at high risk for breast cancer, talk to your health care provider about having an MRI and a mammogram every year.  Breast cancer gene (BRCA) assessment is recommended for women who have family members with BRCA-related cancers. BRCA-related cancers include:  Breast.  Ovarian.  Tubal.  Peritoneal cancers.  Results of the assessment will determine the need for genetic counseling and BRCA1 and BRCA2 testing. Cervical Cancer Routine pelvic examinations to screen for cervical cancer are no longer recommended for nonpregnant women who are considered low  risk for cancer of the pelvic organs (ovaries, uterus, and vagina) and who do not have symptoms. A pelvic examination may be necessary if you have symptoms including those associated with pelvic infections. Ask your health care provider if a screening pelvic exam is right for you.   The Pap test is the screening test for cervical cancer for women who are considered at risk.  If you had a hysterectomy for a problem that was not cancer or a condition that could lead to cancer, then you no longer need Pap tests.  If you are older than 65 years, and you have had normal Pap tests for the past 10 years, you no longer need to have Pap tests.  If you have had past treatment for cervical cancer or a condition that could lead to cancer, you need Pap tests and screening for cancer for at least 20 years after your treatment.  If you no longer get a Pap test, assess your risk factors if they change (such as having a new sexual partner). This can affect whether you should start being screened again.  Some women have medical problems that increase their chance of getting cervical cancer. If this is the case for you, your health care provider may recommend more frequent screening and Pap tests.  The human papillomavirus (HPV) test is another test that may be used for cervical cancer screening. The HPV test looks for the virus that can cause cell changes in the cervix. The cells collected during the Pap test can be tested for HPV.  The HPV test can be used to screen women 30 years of age and older. Getting tested for HPV can extend the interval between normal Pap tests from three to five years.  An HPV test also should be used to screen women of any age who have unclear Pap test results.  After 36 years of age, women should have HPV testing as often as Pap tests.  Colorectal Cancer  This type of cancer can be detected and often prevented.  Routine colorectal cancer screening usually begins at 36 years of  age and continues through 36 years of age.  Your health care provider may recommend screening at an earlier age if you have risk factors for colon cancer.  Your health care provider may also recommend using home test kits to check for hidden blood in the stool.  A small camera at the end of a tube can be used to examine your colon directly (sigmoidoscopy or colonoscopy). This is done to check for the earliest forms of colorectal cancer.  Routine screening usually begins at age 50.  Direct examination of the colon should be repeated every 5-10 years through 36 years of age. However, you may need to be screened more often if early forms of precancerous polyps or small growths are found. Skin Cancer  Check your skin from head to toe regularly.  Tell your health care provider about any new moles or changes in   moles, especially if there is a change in a mole's shape or color.  Also tell your health care provider if you have a mole that is larger than the size of a pencil eraser.  Always use sunscreen. Apply sunscreen liberally and repeatedly throughout the day.  Protect yourself by wearing long sleeves, pants, a wide-brimmed hat, and sunglasses whenever you are outside. HEART DISEASE, DIABETES, AND HIGH BLOOD PRESSURE   Have your blood pressure checked at least every 1-2 years. High blood pressure causes heart disease and increases the risk of stroke.  If you are between 75 years and 42 years old, ask your health care provider if you should take aspirin to prevent strokes.  Have regular diabetes screenings. This involves taking a blood sample to check your fasting blood sugar level.  If you are at a normal weight and have a low risk for diabetes, have this test once every three years after 36 years of age.  If you are overweight and have a high risk for diabetes, consider being tested at a younger age or more often. PREVENTING INFECTION  Hepatitis B  If you have a higher risk for  hepatitis B, you should be screened for this virus. You are considered at high risk for hepatitis B if:  You were born in a country where hepatitis B is common. Ask your health care provider which countries are considered high risk.  Your parents were born in a high-risk country, and you have not been immunized against hepatitis B (hepatitis B vaccine).  You have HIV or AIDS.  You use needles to inject street drugs.  You live with someone who has hepatitis B.  You have had sex with someone who has hepatitis B.  You get hemodialysis treatment.  You take certain medicines for conditions, including cancer, organ transplantation, and autoimmune conditions. Hepatitis C  Blood testing is recommended for:  Everyone born from [birth year] through 1967.  Anyone with known risk factors for hepatitis C. Sexually transmitted infections (STIs)  You should be screened for sexually transmitted infections (STIs) including gonorrhea and chlamydia if:  You are sexually active and are younger than 36 years of age.  You are older than 36 years of age and your health care provider tells you that you are at risk for this type of infection.  Your sexual activity has changed since you were last screened and you are at an increased risk for chlamydia or gonorrhea. Ask your health care provider if you are at risk.  If you do not have HIV, but are at risk, it may be recommended that you take a prescription medicine daily to prevent HIV infection. This is called pre-exposure prophylaxis (PrEP). You are considered at risk if:  You are sexually active and do not regularly use condoms or know the HIV status of your partner(s).  You take drugs by injection.  You are sexually active with a partner who has HIV. Talk with your health care provider about whether you are at high risk of being infected with HIV. If you choose to begin PrEP, you should first be tested for HIV. You should then be tested every 3 months for  as long as you are taking PrEP.  PREGNANCY   If you are premenopausal and you may become pregnant, ask your health care provider about preconception counseling.  If you may become pregnant, take 400 to 800 micrograms (mcg) of folic acid every day.  If you want to prevent pregnancy, talk to your  health care provider about birth control (contraception). OSTEOPOROSIS AND MENOPAUSE   Osteoporosis is a disease in which the bones lose minerals and strength with aging. This can result in serious bone fractures. Your risk for osteoporosis can be identified using a bone density scan.  If you are 65 years of age or older, or if you are at risk for osteoporosis and fractures, ask your health care provider if you should be screened.  Ask your health care provider whether you should take a calcium or vitamin D supplement to lower your risk for osteoporosis.  Menopause may have certain physical symptoms and risks.  Hormone replacement therapy may reduce some of these symptoms and risks. Talk to your health care provider about whether hormone replacement therapy is right for you.  HOME CARE INSTRUCTIONS   Schedule regular health, dental, and eye exams.  Stay current with your immunizations.   Do not use any tobacco products including cigarettes, chewing tobacco, or electronic cigarettes.  If you are pregnant, do not drink alcohol.  If you are breastfeeding, limit how much and how often you drink alcohol.  Limit alcohol intake to no more than 1 drink per day for nonpregnant women. One drink equals 12 ounces of beer, 5 ounces of wine, or 1 ounces of hard liquor.  Do not use street drugs.  Do not share needles.  Ask your health care provider for help if you need support or information about quitting drugs.  Tell your health care provider if you often feel depressed.  Tell your health care provider if you have ever been abused or do not feel safe at home. Document Released: 09/13/2010  Document Revised: 07/15/2013 Document Reviewed: 01/30/2013 ExitCare Patient Information 2015 [employer]. This information is not intended to replace advice given to you by your health care provider. Make sure you discuss any questions you have with your health care provider. Bacterial Vaginosis Bacterial vaginosis is an infection of the vagina. It happens when too many of certain germs (bacteria) grow in the vagina. HOME CARE  Take your medicine as told by your doctor.  Finish your medicine even if you start to feel better.  Do not have sex until you finish your medicine and are better.  Tell your sex partner that you have an infection. They should see their doctor for treatment.  Practice safe sex. Use condoms. Have only one sex partner. GET HELP IF:  You are not getting better after 3 days of treatment.  You have more grey fluid (discharge) coming from your vagina than before.  You have more pain than before.  You have a fever. MAKE SURE YOU:   Understand these instructions.  Will watch your condition.  Will get help right away if you are not doing well or get worse. Document Released: 12/08/2007 Document Revised: 12/19/2012 Document Reviewed: 10/10/2012 Exit[employer] Patient Information 2015 [employer]. This information is not intended to replace advice given to you by your health care provider. Make sure you discuss any questions you have with your health care provider.  

## 2014-05-30 LAB — GC/CHLAMYDIA PROBE AMP
CT Probe RNA: NEGATIVE
GC PROBE AMP APTIMA: NEGATIVE

## 2014-05-30 LAB — URINE CULTURE
Colony Count: NO GROWTH
Organism ID, Bacteria: NO GROWTH

## 2014-05-30 LAB — CYTOLOGY - PAP

## 2014-06-18 ENCOUNTER — Telehealth: Payer: Self-pay | Admitting: *Deleted

## 2014-06-18 MED ORDER — FLUCONAZOLE 150 MG PO TABS: 150.0000 mg | ORAL_TABLET | Freq: Once | ORAL | Status: DC

## 2014-06-18 NOTE — Telephone Encounter (Signed)
Rx sent, left on voicemail Rx sent and OV with no relief

## 2014-06-18 NOTE — Telephone Encounter (Signed)
Pt called c/o yeast infection, c/o vaginal itching and white discharge. Please advise

## 2014-06-18 NOTE — Telephone Encounter (Signed)
Please E scribe Diflucan 150, office visit if no relief.

## 2014-10-30 ENCOUNTER — Ambulatory Visit (INDEPENDENT_AMBULATORY_CARE_PROVIDER_SITE_OTHER): Payer: BLUE CROSS/BLUE SHIELD | Admitting: Women's Health

## 2014-10-30 ENCOUNTER — Encounter: Payer: Self-pay | Admitting: Women's Health

## 2014-10-30 DIAGNOSIS — Z30432 Encounter for removal of intrauterine contraceptive device: Secondary | ICD-10-CM

## 2014-10-30 DIAGNOSIS — Z30019 Encounter for initial prescription of contraceptives, unspecified: Secondary | ICD-10-CM

## 2014-10-30 DIAGNOSIS — N760 Acute vaginitis: Secondary | ICD-10-CM

## 2014-10-30 DIAGNOSIS — B9689 Other specified bacterial agents as the cause of diseases classified elsewhere: Secondary | ICD-10-CM

## 2014-10-30 DIAGNOSIS — Z975 Presence of (intrauterine) contraceptive device: Secondary | ICD-10-CM | POA: Diagnosis not present

## 2014-10-30 DIAGNOSIS — A499 Bacterial infection, unspecified: Secondary | ICD-10-CM

## 2014-10-30 LAB — URINALYSIS W MICROSCOPIC + REFLEX CULTURE
BILIRUBIN URINE: NEGATIVE
Casts: NONE SEEN [LPF]
GLUCOSE, UA: NEGATIVE
Ketones, ur: NEGATIVE
Nitrite: NEGATIVE
PH: 7 (ref 5.0–8.0)
Protein, ur: NEGATIVE
Specific Gravity, Urine: 1.02 (ref 1.001–1.035)
Yeast: NONE SEEN [HPF]

## 2014-10-30 LAB — WET PREP FOR TRICH, YEAST, CLUE
Clue Cells Wet Prep HPF POC: NONE SEEN
Trich, Wet Prep: NONE SEEN

## 2014-10-30 MED ORDER — DESOGESTREL-ETHINYL ESTRADIOL 0.15-0.02/0.01 MG (21/5) PO TABS: 1.0000 | ORAL_TABLET | Freq: Every day | ORAL | Status: DC

## 2014-10-30 MED ORDER — METRONIDAZOLE 500 MG PO TABS: 500.0000 mg | ORAL_TABLET | Freq: Two times a day (BID) | ORAL | Status: DC

## 2014-10-30 MED ORDER — FLUCONAZOLE 150 MG PO TABS: 150.0000 mg | ORAL_TABLET | Freq: Once | ORAL | Status: DC

## 2014-10-30 NOTE — Patient Instructions (Signed)
Bacterial Vaginosis Bacterial vaginosis is a vaginal infection that occurs when the normal balance of bacteria in the vagina is disrupted. It results from an overgrowth of certain bacteria. This is the most common vaginal infection in women of childbearing age. Treatment is important to prevent complications, especially in pregnant women, as it can cause a premature delivery. CAUSES  Bacterial vaginosis is caused by an increase in harmful bacteria that are normally present in smaller amounts in the vagina. Several different kinds of bacteria can cause bacterial vaginosis. However, the reason that the condition develops is not fully understood. RISK FACTORS Certain activities or behaviors can put you at an increased risk of developing bacterial vaginosis, including:  Having a new sex partner or multiple sex partners.  Douching.  Using an intrauterine device (IUD) for contraception. Women do not get bacterial vaginosis from toilet seats, bedding, swimming pools, or contact with objects around them. SIGNS AND SYMPTOMS  Some women with bacterial vaginosis have no signs or symptoms. Common symptoms include:  Grey vaginal discharge.  A fishlike odor with discharge, especially after sexual intercourse.  Itching or burning of the vagina and vulva.  Burning or pain with urination. DIAGNOSIS  Your health care provider will take a medical history and examine the vagina for signs of bacterial vaginosis. A sample of vaginal fluid may be taken. Your health care provider will look at this sample under a microscope to check for bacteria and abnormal cells. A vaginal pH test may also be done.  TREATMENT  Bacterial vaginosis may be treated with antibiotic medicines. These may be given in the form of a pill or a vaginal cream. A second round of antibiotics may be prescribed if the condition comes back after treatment.  HOME CARE INSTRUCTIONS   Only take over-the-counter or prescription medicines as  directed by your health care provider.  If antibiotic medicine was prescribed, take it as directed. Make sure you finish it even if you start to feel better.  Do not have sex until treatment is completed.  Tell all sexual partners that you have a vaginal infection. They should see their health care provider and be treated if they have problems, such as a mild rash or itching.  Practice safe sex by using condoms and only having one sex partner. SEEK MEDICAL CARE IF:   Your symptoms are not improving after 3 days of treatment.  You have increased discharge or pain.  You have a fever. MAKE SURE YOU:   Understand these instructions.  Will watch your condition.  Will get help right away if you are not doing well or get worse. FOR MORE INFORMATION  Centers for Disease Control and Prevention, Division of STD Prevention: www.cdc.gov/std American Sexual Health Association (ASHA): www.ashastd.org  Document Released: 02/28/2005 Document Revised: 12/19/2012 Document Reviewed: 10/10/2012 Exit[employer] Patient Information 2015 [employer]. This information is not intended to replace advice given to you by your health care provider. Make sure you discuss any questions you have with your health care provider.  

## 2014-10-30 NOTE — Addendum Note (Signed)
Addended by: Aura Camps on: 10/30/2014 03:01 PM   Modules accepted: Orders

## 2014-10-30 NOTE — Progress Notes (Signed)
Patient ID: Tor Netters, female   DOB: November 29, 1980, 34 y.o.   MRN: [id number] Presents with complaint of increased discharge with odor, irritation and some itching. Has not been sexually active for 1 year. Negative STD screen 05/2014. Some urinary frequency without pain or burning. Denies abdominal pain or fever. Mirena IUD placed 06/2012 but has had spotting most days since, would like to try other type of contraception. History of migraines without aura and PCO S.  Exam: Appears well. External genitalia within normal limits, speculum exam moderate amount of a watery bloody discharge noted, wet prep positive for amines, clues, TNTC bacteria. IUD strings visualized grasped with ring forcep removed intact shown to patient and discarded. UA: 2+ blood, +2 leukocytes, 0-5 WBCs, 0 did 2 RBCs, few bacteria.  Bacteria vaginosis Contraception management  Plan: Flagyl 500 twice daily for 7 days #14, alcohol precautions reviewed. Contraception/cycle reviewed will try Mircette prescription, proper use given and reviewed slight risk for blood clots and strokes, start today, instructed to call if continued spotting or discharge.  Condoms encouraged if sexually active.

## 2014-10-31 LAB — URINE CULTURE
Colony Count: NO GROWTH
Organism ID, Bacteria: NO GROWTH

## 2014-12-10 ENCOUNTER — Telehealth: Payer: Self-pay | Admitting: *Deleted

## 2014-12-10 NOTE — Telephone Encounter (Signed)
Yes okay to  switch to capsules, please E scribe for patient Prozac 20 mg capsules #90.

## 2014-12-10 NOTE — Telephone Encounter (Signed)
Pt currently taking Prozac 20 mg tablet, states the tablets price have increased, asked if okay to switch to capsules? Please advise

## 2014-12-11 MED ORDER — FLUOXETINE HCL 20 MG PO CAPS
20.0000 mg | ORAL_CAPSULE | Freq: Every day | ORAL | Status: DC
Start: 2014-12-11 — End: 2018-04-18

## 2014-12-11 NOTE — Telephone Encounter (Signed)
Left on voicemail Rx sent 

## 2015-01-23 ENCOUNTER — Encounter: Payer: Self-pay | Admitting: Women's Health

## 2015-01-23 ENCOUNTER — Ambulatory Visit (INDEPENDENT_AMBULATORY_CARE_PROVIDER_SITE_OTHER): Payer: BLUE CROSS/BLUE SHIELD | Admitting: Women's Health

## 2015-01-23 VITALS — BP 110/80 | Ht 64.0 in | Wt 130.0 lb

## 2015-01-23 DIAGNOSIS — A499 Bacterial infection, unspecified: Secondary | ICD-10-CM | POA: Diagnosis not present

## 2015-01-23 DIAGNOSIS — N760 Acute vaginitis: Secondary | ICD-10-CM

## 2015-01-23 DIAGNOSIS — B373 Candidiasis of vulva and vagina: Secondary | ICD-10-CM | POA: Diagnosis not present

## 2015-01-23 DIAGNOSIS — N898 Other specified noninflammatory disorders of vagina: Secondary | ICD-10-CM

## 2015-01-23 DIAGNOSIS — R350 Frequency of micturition: Secondary | ICD-10-CM | POA: Diagnosis not present

## 2015-01-23 DIAGNOSIS — B3731 Acute candidiasis of vulva and vagina: Secondary | ICD-10-CM

## 2015-01-23 DIAGNOSIS — B9689 Other specified bacterial agents as the cause of diseases classified elsewhere: Secondary | ICD-10-CM

## 2015-01-23 LAB — URINALYSIS W MICROSCOPIC + REFLEX CULTURE
BILIRUBIN URINE: NEGATIVE
Casts: NONE SEEN [LPF]
Crystals: NONE SEEN [HPF]
GLUCOSE, UA: NEGATIVE
NITRITE: NEGATIVE
PH: 6 (ref 5.0–8.0)
Protein, ur: NEGATIVE
SPECIFIC GRAVITY, URINE: 1.025 (ref 1.001–1.035)
Yeast: NONE SEEN [HPF]

## 2015-01-23 LAB — WET PREP FOR TRICH, YEAST, CLUE: Trich, Wet Prep: NONE SEEN

## 2015-01-23 MED ORDER — METRONIDAZOLE 0.75 % VA GEL: VAGINAL | Status: DC

## 2015-01-23 MED ORDER — FLUCONAZOLE 100 MG PO TABS: 100.0000 mg | ORAL_TABLET | Freq: Every day | ORAL | Status: DC

## 2015-01-23 NOTE — Progress Notes (Signed)
Patient ID: Tor NettersHeather N Mcwright, female   DOB: 03-05-1981, 34 y.o.   MRN: [id number] Presents with complaint of increased vaginal discharge with odor and mild itching. Denies UTI symptoms, abdominal pain or fever. Same partner negative STD screen. Monthly cycle on Mircette. Has had a problem with recurrent BV. States she feels she gets BV and yeast most months after her cycle. Just finished cycle.  Exam: Appears well. External genitalia within normal limits, speculum exam scant white discharge no odor noted minimal erythema. Wet prep positive for yeast, moderate clue cells, TNTC bacteria. Bimanual no CMT or tenderness. UA: 1+ blood, 10- 20 rbc's, few bacteria  Recurrent bacteria vaginosis and yeast vaginitis   Plan: Options reviewed, discussed boric acid gelcaps,. MetroGel vaginal cream 1 applicator at bedtime 5, alcohol precautions reviewed, will try half applicator of MetroGel every month after cycle, Diflucan 100 2 tablets today, and 1 tablet monthly after cycle. Instructed to call if no relief of symptoms. Urine culture pending

## 2015-01-23 NOTE — Patient Instructions (Signed)

## 2015-01-26 LAB — URINE CULTURE
Colony Count: NO GROWTH
Organism ID, Bacteria: NO GROWTH

## 2015-06-03 ENCOUNTER — Other Ambulatory Visit: Payer: Self-pay | Admitting: Women's Health

## 2015-07-06 ENCOUNTER — Other Ambulatory Visit: Payer: Self-pay

## 2015-07-10 ENCOUNTER — Other Ambulatory Visit: Payer: Self-pay | Admitting: *Deleted

## 2015-07-14 ENCOUNTER — Other Ambulatory Visit: Payer: Self-pay

## 2015-07-20 ENCOUNTER — Other Ambulatory Visit: Payer: Self-pay | Admitting: *Deleted

## 2015-08-26 ENCOUNTER — Other Ambulatory Visit: Payer: Self-pay

## 2015-09-07 ENCOUNTER — Telehealth: Payer: Self-pay | Admitting: *Deleted

## 2015-09-07 MED ORDER — DESOGESTREL-ETHINYL ESTRADIOL 0.15-0.02/0.01 MG (21/5) PO TABS: 1.0000 | ORAL_TABLET | Freq: Every day | ORAL | Status: DC

## 2015-09-07 NOTE — Telephone Encounter (Signed)
Pt has annual schedule don 10/06/15 needs refill on birth control pills, refills sent until appointment.

## 2015-09-30 DIAGNOSIS — G43119 Migraine with aura, intractable, without status migrainosus: Secondary | ICD-10-CM | POA: Diagnosis not present

## 2015-10-06 ENCOUNTER — Encounter: Payer: BLUE CROSS/BLUE SHIELD | Admitting: Women's Health

## 2015-10-06 DIAGNOSIS — Z0289 Encounter for other administrative examinations: Secondary | ICD-10-CM

## 2015-10-29 DIAGNOSIS — F411 Generalized anxiety disorder: Secondary | ICD-10-CM | POA: Diagnosis not present

## 2015-10-29 DIAGNOSIS — R58 Hemorrhage, not elsewhere classified: Secondary | ICD-10-CM | POA: Diagnosis not present

## 2015-10-29 DIAGNOSIS — G43009 Migraine without aura, not intractable, without status migrainosus: Secondary | ICD-10-CM | POA: Diagnosis not present

## 2015-10-29 DIAGNOSIS — F900 Attention-deficit hyperactivity disorder, predominantly inattentive type: Secondary | ICD-10-CM | POA: Diagnosis not present

## 2015-12-10 DIAGNOSIS — Z23 Encounter for immunization: Secondary | ICD-10-CM | POA: Diagnosis not present

## 2016-07-07 DIAGNOSIS — R1011 Right upper quadrant pain: Secondary | ICD-10-CM | POA: Diagnosis not present

## 2016-07-07 DIAGNOSIS — F900 Attention-deficit hyperactivity disorder, predominantly inattentive type: Secondary | ICD-10-CM | POA: Diagnosis not present

## 2016-07-13 ENCOUNTER — Other Ambulatory Visit: Payer: Self-pay | Admitting: Family Medicine

## 2016-07-13 DIAGNOSIS — R1011 Right upper quadrant pain: Secondary | ICD-10-CM

## 2016-07-14 ENCOUNTER — Encounter (HOSPITAL_BASED_OUTPATIENT_CLINIC_OR_DEPARTMENT_OTHER): Payer: Self-pay | Admitting: *Deleted

## 2016-07-14 ENCOUNTER — Other Ambulatory Visit: Payer: Self-pay | Admitting: Pediatrics

## 2016-07-14 ENCOUNTER — Emergency Department (HOSPITAL_BASED_OUTPATIENT_CLINIC_OR_DEPARTMENT_OTHER)
Admission: EM | Admit: 2016-07-14 | Discharge: 2016-07-14 | Disposition: A | Discharge status: Home / Self Care | Payer: BLUE CROSS/BLUE SHIELD | Attending: Emergency Medicine | Admitting: Emergency Medicine

## 2016-07-14 DIAGNOSIS — R51 Headache: Secondary | ICD-10-CM | POA: Diagnosis not present

## 2016-07-14 DIAGNOSIS — Z79899 Other long term (current) drug therapy: Secondary | ICD-10-CM | POA: Diagnosis not present

## 2016-07-14 DIAGNOSIS — G43809 Other migraine, not intractable, without status migrainosus: Secondary | ICD-10-CM | POA: Insufficient documentation

## 2016-07-14 MED ORDER — METOCLOPRAMIDE HCL 5 MG/ML IJ SOLN
10.0000 mg | Freq: Once | INTRAMUSCULAR | Status: AC
  Administered 2016-07-14: 10 mg via INTRAVENOUS
  Filled 2016-07-14: qty 2

## 2016-07-14 MED ORDER — SODIUM CHLORIDE 0.9 % IV BOLUS (SEPSIS)
1000.0000 mL | Freq: Once | INTRAVENOUS | Status: AC
  Administered 2016-07-14: 1000 mL via INTRAVENOUS

## 2016-07-14 MED ORDER — DEXAMETHASONE SODIUM PHOSPHATE 10 MG/ML IJ SOLN
10.0000 mg | Freq: Once | INTRAMUSCULAR | Status: AC
  Administered 2016-07-14: 10 mg via INTRAVENOUS
  Filled 2016-07-14: qty 1

## 2016-07-14 MED ORDER — DIPHENHYDRAMINE HCL 50 MG/ML IJ SOLN
25.0000 mg | Freq: Once | INTRAMUSCULAR | Status: AC
  Administered 2016-07-14: 25 mg via INTRAVENOUS
  Filled 2016-07-14: qty 1

## 2016-07-14 MED ORDER — KETOROLAC TROMETHAMINE 30 MG/ML IJ SOLN
30.0000 mg | Freq: Once | INTRAMUSCULAR | Status: AC
  Administered 2016-07-14: 30 mg via INTRAVENOUS
  Filled 2016-07-14: qty 1

## 2016-07-14 NOTE — ED Triage Notes (Signed)
Migraine headache x 2 days. She has been taking Tramadol, Maxalt, Ibuprofen with no relief.

## 2016-07-14 NOTE — ED Notes (Signed)
R side headache x 2 days, states face feels raw with nausea no vomiting. States feels like typical migraine with photophobia but new symptom of blurred vision to L eye. Pt has taken usual migraine medications without relief.

## 2016-07-14 NOTE — ED Provider Notes (Signed)
[facility]   CSN: [id number] Arrival date & time: 07/14/16  1418     History   Chief Complaint Chief Complaint  Patient presents with  . Headache    HPI Cassie English is a 36 y.o. female who presents with a right-sided headache. Past medical history significant for migraines. Patient states that the headache started 2 days ago and gradually worsened. She's been taking tramadol, Maxalt, ibuprofen with no relief. She states that the right side of her face "feels raw and tender". She has a sharp stinging sensation to the right side of her face and head. She also reports nausea and photophobia without vomiting. She is her headache feels exactly like her prior migraines however she has blurry vision on the right side which she has not had before. No fever, loss of consciousness, loss of vision, weakness, numbness or tingling. She does not have a neurologist and it has not had imaging of her head in the past her migraines are managed by her PCP.  HPI  Past Medical History:  Diagnosis Date  . Migraine     Patient Active Problem List   Diagnosis Date Noted  . PCOS (polycystic ovarian syndrome) 01/10/2014  . Migraine     Past Surgical History:  Procedure Laterality Date  . CERVICAL CERCLAGE    . WISDOM TOOTH EXTRACTION      OB History    Gravida Para Term Preterm AB Living   4 2 2   2 2    SAB TAB Ectopic Multiple Live Births   2               Home Medications    Prior to Admission medications   Medication Sig Start Date End Date Taking? Authorizing Provider  ADDERALL XR 30 MG 24 hr capsule Take 1 tablet by mouth daily. 04/17/14  Yes Historical Provider, MD  FLUoxetine (PROZAC) 20 MG capsule Take 1 capsule (20 mg total) by mouth daily. 12/11/14  Yes Harrington ChallengerNancy J Young, NP  Ibuprofen (ADVIL PO) Take by mouth as needed.    Yes Historical Provider, MD  rizatriptan (MAXALT) 10 MG tablet Take 10 mg by mouth as needed for migraine. May repeat in 2 hours if  needed   Yes Historical Provider, MD  Topiramate (TOPAMAX PO) Take by mouth.   Yes Historical Provider, MD  desogestrel-ethinyl estradiol (VIORELE) 0.15-0.02/0.01 MG (21/5) tablet Take 1 tablet by mouth daily. 09/07/15   Harrington ChallengerNancy J Young, NP  fluconazole (DIFLUCAN) 100 MG tablet Take 1 tablet (100 mg total) by mouth daily. 01/23/15   Harrington ChallengerNancy J Young, NP  metroNIDAZOLE (METROGEL VAGINAL) 0.75 % vaginal gel 1 applicator per vagina at HS x 5 01/23/15   Harrington ChallengerNancy J Young, NP    Family History Family History  Problem Relation Age of Onset  . Diabetes Father   . Aneurysm Father   . Dementia Maternal Grandfather     Social History Social History  Substance Use Topics  . Smoking status: Never Smoker  . Smokeless tobacco: Never Used  . Alcohol use No     Allergies   Codeine; Nubain [nalbuphine hcl]; and Phenergan [promethazine hcl]   Review of Systems Review of Systems  Constitutional: Negative for fever.  Eyes: Positive for photophobia and visual disturbance. Negative for pain.  Musculoskeletal: Negative for neck pain.  Neurological: Positive for headaches. Negative for syncope, weakness and numbness.  All other systems reviewed and are negative.    Physical Exam Updated Vital Signs BP  111/78 (BP Location: Left Arm)   Pulse 77   Temp 98.3 F (36.8 C) (Oral)   Resp 18   Ht 5\' 2"  (1.575 m)   Wt 67.1 kg   LMP 06/23/2016   SpO2 100%   BMI 27.07 kg/m   Physical Exam  Constitutional: She is oriented to person, place, and time. She appears well-developed and well-nourished. No distress.  HENT:  Head: Normocephalic and atraumatic.  Eyes: Conjunctivae are normal. Pupils are equal, round, and reactive to light. Right eye exhibits no discharge. Left eye exhibits no discharge. No scleral icterus.  Neck: Normal range of motion.  Cardiovascular: Normal rate.   Pulmonary/Chest: Effort normal. No respiratory distress.  Abdominal: She exhibits no distension.  Neurological: She is alert and  oriented to person, place, and time.  Mental Status:  Alert, oriented, thought content appropriate, able to give a coherent history. Speech fluent without evidence of aphasia. Able to follow 2 step commands without difficulty.  Cranial Nerves:  II:  Peripheral visual fields grossly normal, pupils equal, round, reactive to light III,IV, VI: ptosis not present, extra-ocular motions intact bilaterally  V,VII: smile symmetric. Subjective "tenderness" to right side of face and scalp. VIII: hearing grossly normal to voice  X: uvula elevates symmetrically  XI: bilateral shoulder shrug symmetric and strong XII: midline tongue extension without fassiculations Motor:  Normal tone. 5/5 in upper and lower extremities bilaterally including strong and equal grip strength and dorsiflexion/plantar flexion Sensory: Pinprick and light touch normal in all extremities.  Cerebellar: normal finger-to-nose with bilateral upper extremities Gait: normal gait and balance CV: distal pulses palpable throughout    Skin: Skin is warm and dry.  Psychiatric: She has a normal mood and affect. Her behavior is normal.  Nursing note and vitals reviewed.    ED Treatments / Results  Labs (all labs ordered are listed, but only abnormal results are displayed) Labs Reviewed - No data to display  EKG  EKG Interpretation None       Radiology No results found.  Procedures Procedures (including critical care time)  Medications Ordered in ED Medications  ketorolac (TORADOL) 30 MG/ML injection 30 mg (30 mg Intravenous Given 07/14/16 1502)  metoCLOPramide (REGLAN) injection 10 mg (10 mg Intravenous Given 07/14/16 1502)  diphenhydrAMINE (BENADRYL) injection 25 mg (25 mg Intravenous Given 07/14/16 1502)  dexamethasone (DECADRON) injection 10 mg (10 mg Intravenous Given 07/14/16 1502)  sodium chloride 0.9 % bolus 1,000 mL (1,000 mLs Intravenous New Bag/Given 07/14/16 1458)     Initial Impression / Assessment and Plan / ED  Course  I have reviewed the triage vital signs and the nursing notes.  Pertinent labs & imaging results that were available during my care of the patient were reviewed by me and considered in my medical decision making (see chart for details).  36 year old female with migraine headache. Vital signs are normal. Neuro exam overall unremarkable. After a migraine cocktail and fluids she reports feeling significantly better. Pain went from 8 to a 1 and she states she is hungry. Advised follow up with PCP as needed. Return precautions given.  Final Clinical Impressions(s) / ED Diagnoses   Final diagnoses:  Other migraine without status migrainosus, not intractable    New Prescriptions New Prescriptions   No medications on file     Bethel Born, PA-C 07/14/16 1556    Jacalyn Lefevre, MD 07/21/16 [phone]

## 2016-07-14 NOTE — Discharge Instructions (Signed)
Follow-up with your doctor as needed.  Return for worsening symptoms ?

## 2016-07-22 ENCOUNTER — Ambulatory Visit
Admission: RE | Admit: 2016-07-22 | Discharge: 2016-07-22 | Disposition: A | Discharge status: Home / Self Care | Payer: BLUE CROSS/BLUE SHIELD | Source: Ambulatory Visit | Attending: Family Medicine | Admitting: Family Medicine

## 2016-07-22 DIAGNOSIS — R1011 Right upper quadrant pain: Secondary | ICD-10-CM | POA: Diagnosis not present

## 2016-07-27 ENCOUNTER — Encounter: Payer: Self-pay | Admitting: Gynecology

## 2017-01-12 DIAGNOSIS — G43009 Migraine without aura, not intractable, without status migrainosus: Secondary | ICD-10-CM | POA: Diagnosis not present

## 2017-01-12 DIAGNOSIS — F900 Attention-deficit hyperactivity disorder, predominantly inattentive type: Secondary | ICD-10-CM | POA: Diagnosis not present

## 2017-01-12 DIAGNOSIS — F411 Generalized anxiety disorder: Secondary | ICD-10-CM | POA: Diagnosis not present

## 2017-03-22 DIAGNOSIS — R1011 Right upper quadrant pain: Secondary | ICD-10-CM | POA: Diagnosis not present

## 2017-03-22 DIAGNOSIS — G43009 Migraine without aura, not intractable, without status migrainosus: Secondary | ICD-10-CM | POA: Diagnosis not present

## 2017-05-29 DIAGNOSIS — H10023 Other mucopurulent conjunctivitis, bilateral: Secondary | ICD-10-CM | POA: Diagnosis not present

## 2017-05-29 DIAGNOSIS — J029 Acute pharyngitis, unspecified: Secondary | ICD-10-CM | POA: Diagnosis not present

## 2017-07-10 DIAGNOSIS — R109 Unspecified abdominal pain: Secondary | ICD-10-CM | POA: Diagnosis not present

## 2017-07-12 DIAGNOSIS — R109 Unspecified abdominal pain: Secondary | ICD-10-CM | POA: Diagnosis not present

## 2017-07-12 DIAGNOSIS — Z79899 Other long term (current) drug therapy: Secondary | ICD-10-CM | POA: Diagnosis not present

## 2017-07-14 DIAGNOSIS — N23 Unspecified renal colic: Secondary | ICD-10-CM | POA: Diagnosis not present

## 2017-07-18 DIAGNOSIS — N201 Calculus of ureter: Secondary | ICD-10-CM | POA: Diagnosis not present

## 2017-07-18 DIAGNOSIS — N202 Calculus of kidney with calculus of ureter: Secondary | ICD-10-CM | POA: Diagnosis not present

## 2017-07-18 DIAGNOSIS — N200 Calculus of kidney: Secondary | ICD-10-CM | POA: Diagnosis not present

## 2017-07-28 DIAGNOSIS — N200 Calculus of kidney: Secondary | ICD-10-CM | POA: Diagnosis not present

## 2017-07-31 DIAGNOSIS — R8271 Bacteriuria: Secondary | ICD-10-CM | POA: Diagnosis not present

## 2017-07-31 DIAGNOSIS — N23 Unspecified renal colic: Secondary | ICD-10-CM | POA: Diagnosis not present

## 2017-07-31 DIAGNOSIS — N202 Calculus of kidney with calculus of ureter: Secondary | ICD-10-CM | POA: Diagnosis not present

## 2017-07-31 DIAGNOSIS — N3000 Acute cystitis without hematuria: Secondary | ICD-10-CM | POA: Diagnosis not present

## 2017-08-04 DIAGNOSIS — N202 Calculus of kidney with calculus of ureter: Secondary | ICD-10-CM | POA: Diagnosis not present

## 2017-11-14 DIAGNOSIS — F900 Attention-deficit hyperactivity disorder, predominantly inattentive type: Secondary | ICD-10-CM | POA: Diagnosis not present

## 2017-11-14 DIAGNOSIS — G43009 Migraine without aura, not intractable, without status migrainosus: Secondary | ICD-10-CM | POA: Diagnosis not present

## 2017-11-14 DIAGNOSIS — F411 Generalized anxiety disorder: Secondary | ICD-10-CM | POA: Diagnosis not present

## 2018-02-14 ENCOUNTER — Encounter: Payer: Self-pay | Admitting: Neurology

## 2018-02-15 ENCOUNTER — Institutional Professional Consult (permissible substitution): Payer: BLUE CROSS/BLUE SHIELD | Admitting: Neurology

## 2018-02-15 ENCOUNTER — Telehealth: Payer: Self-pay | Admitting: Neurology

## 2018-02-15 NOTE — Telephone Encounter (Signed)
Called the pt and made her aware we would have to cancel the apt for today since we were having technical difficulties with our system. No answer. LVM twice for the patient informing we would call back to reschedule.

## 2018-04-18 ENCOUNTER — Ambulatory Visit (INDEPENDENT_AMBULATORY_CARE_PROVIDER_SITE_OTHER): Payer: BLUE CROSS/BLUE SHIELD | Admitting: Women's Health

## 2018-04-18 ENCOUNTER — Encounter: Payer: Self-pay | Admitting: Women's Health

## 2018-04-18 VITALS — BP 120/82

## 2018-04-18 DIAGNOSIS — N898 Other specified noninflammatory disorders of vagina: Secondary | ICD-10-CM

## 2018-04-18 DIAGNOSIS — R300 Dysuria: Secondary | ICD-10-CM | POA: Diagnosis not present

## 2018-04-18 LAB — WET PREP FOR TRICH, YEAST, CLUE

## 2018-04-18 MED ORDER — METRONIDAZOLE 500 MG PO TABS: 500.0000 mg | ORAL_TABLET | Freq: Two times a day (BID) | ORAL | 0 refills | Status: DC

## 2018-04-18 NOTE — Patient Instructions (Signed)

## 2018-04-18 NOTE — Progress Notes (Signed)
38 year old D WF G4 P2 presents with request for STD screen.  Past partner unfaithful.  Having increased white discharge with occasional odor.  Denies abdominal pain, urinary symptoms or fever.  Irregular light cycles on Micronor.  History of recurrent BV in the past.  Exam: Appears well.  Abdomen soft, nontender, no CVAT.  External genitalia within normal limits, speculum exam moderate amount of a white adherent discharge with odor noted, wet prep positive for clues, TNTC bacteria.  GC/chlamydia culture taken.  Bimanual no CMT or adnexal tenderness.  Bacterial vaginosis STD screen  Plan: Flagyl 500 twice daily for 7 days, alcohol precautions reviewed.  GC/Chlamydia culture pending, will check HIV, hepatitis and RPR at annual exam scheduled next week.

## 2018-04-19 LAB — C. TRACHOMATIS/N. GONORRHOEAE RNA
C. trachomatis RNA, TMA: NOT DETECTED
N. GONORRHOEAE RNA, TMA: NOT DETECTED

## 2018-04-27 DIAGNOSIS — D485 Neoplasm of uncertain behavior of skin: Secondary | ICD-10-CM | POA: Diagnosis not present

## 2018-04-27 DIAGNOSIS — D2239 Melanocytic nevi of other parts of face: Secondary | ICD-10-CM | POA: Diagnosis not present

## 2018-05-12 IMAGING — US US ABDOMEN LIMITED
1 series · 14 of 25 positions shown · non-contrast
Comparison: None

CLINICAL DATA: RIGHT upper quadrant abdominal pain at this site
with nausea nausea and constipation off and on for 8 months

EXAM:
US ABDOMEN LIMITED - RIGHT UPPER QUADRANT

[Series 1: us abdomen limited · 0.20mm/px · 14 of 40 slices shown]
[im 1/40]
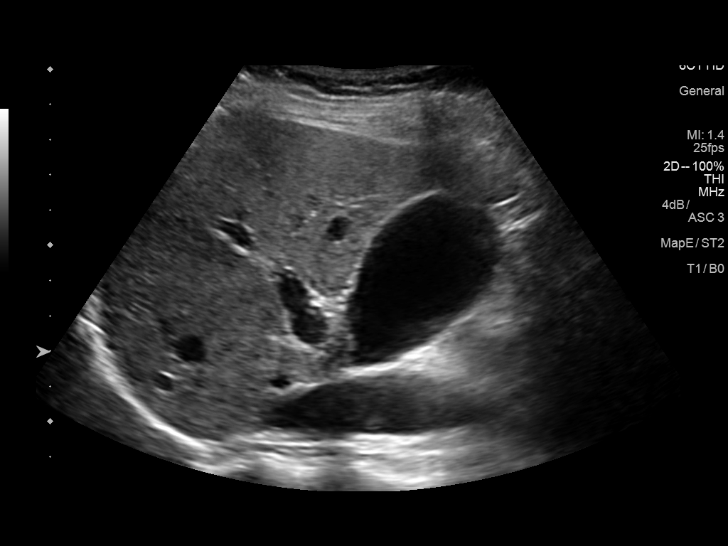
[im 4/40]
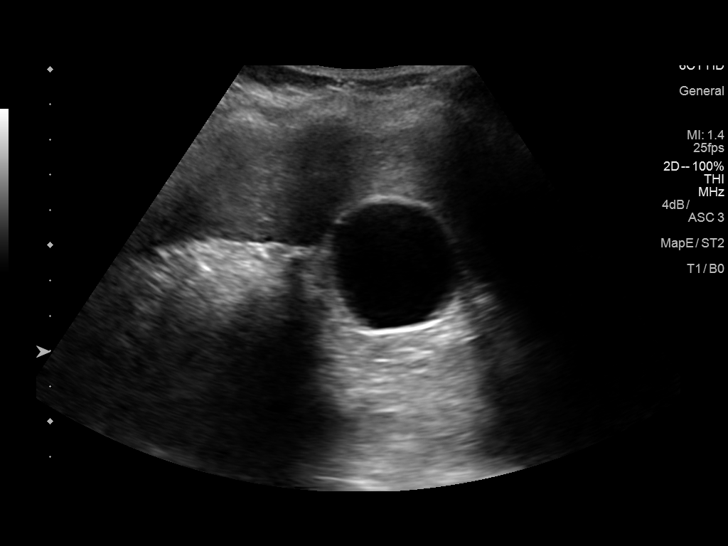
[im 7/40]
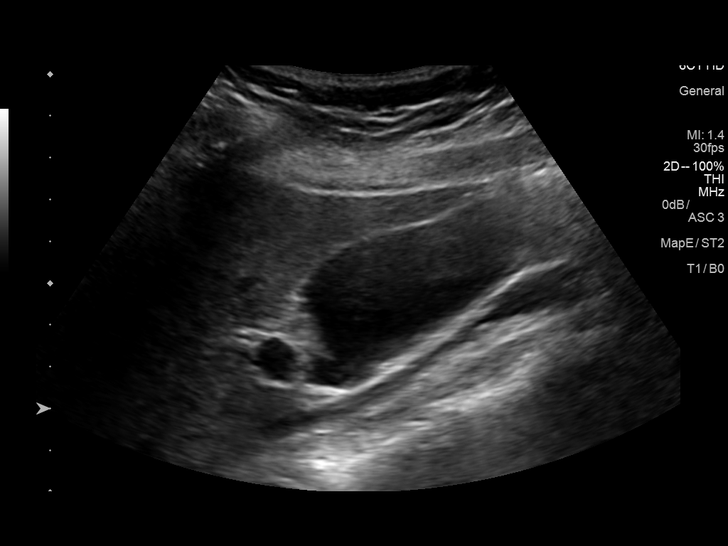
[im 10/40]
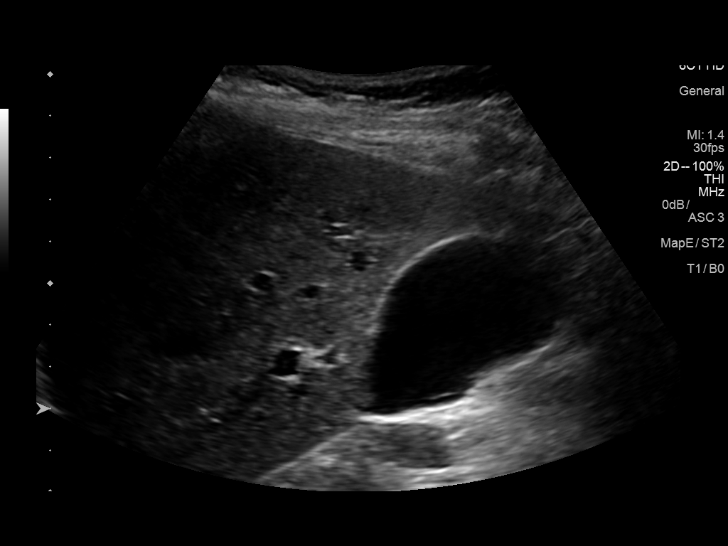
[im 14/40]
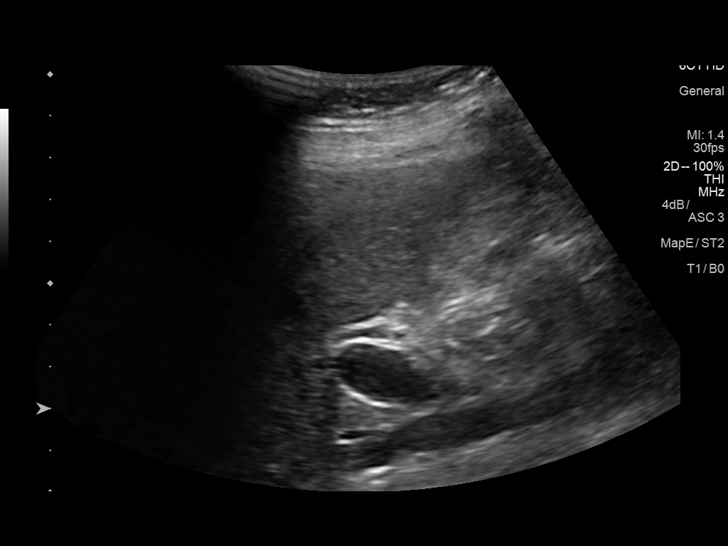
[im 15/40]
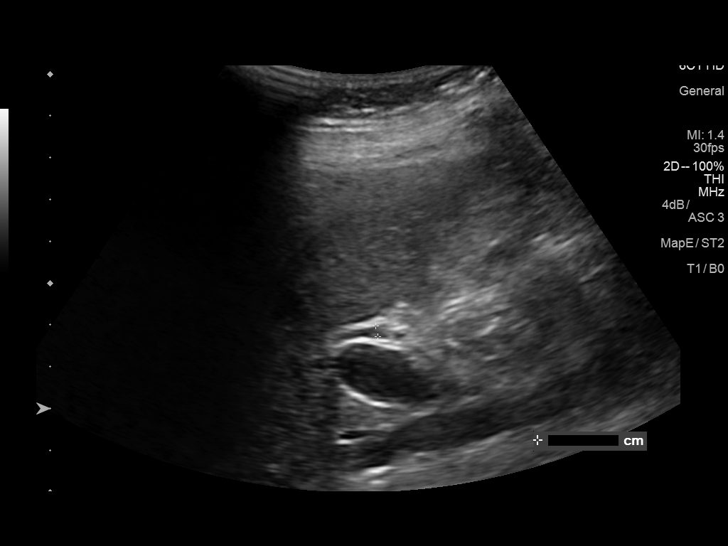
[im 18/40]
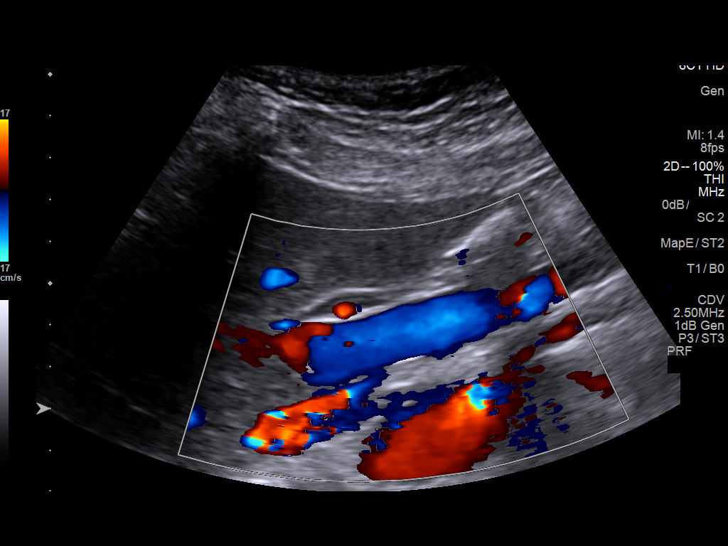
[im 22/40]
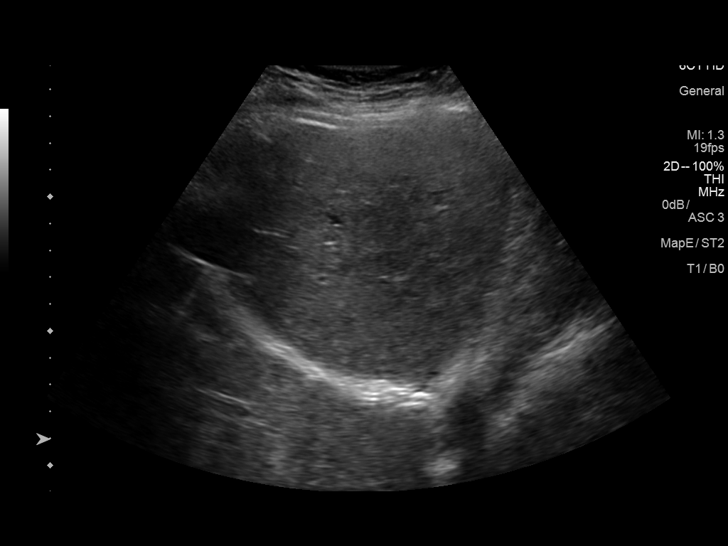
[im 25/40]
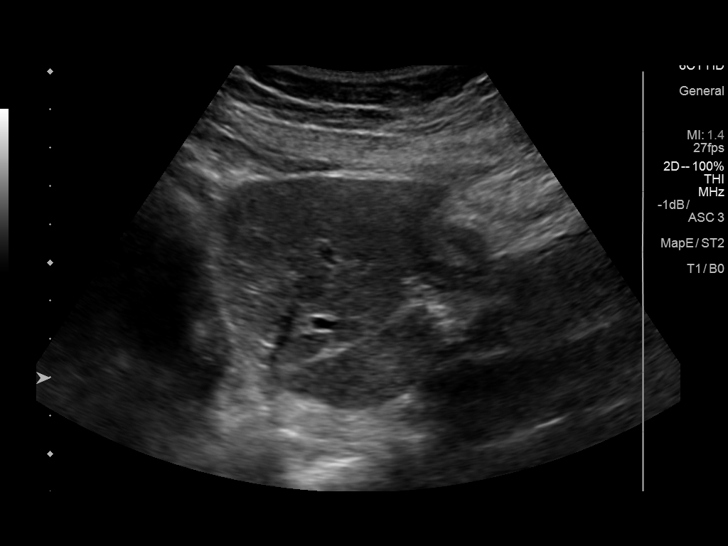
[im 27/40]
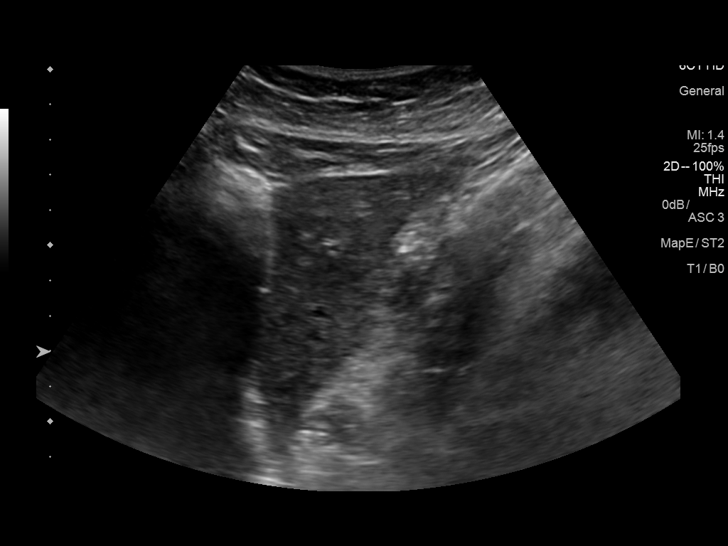
[im 30/40]
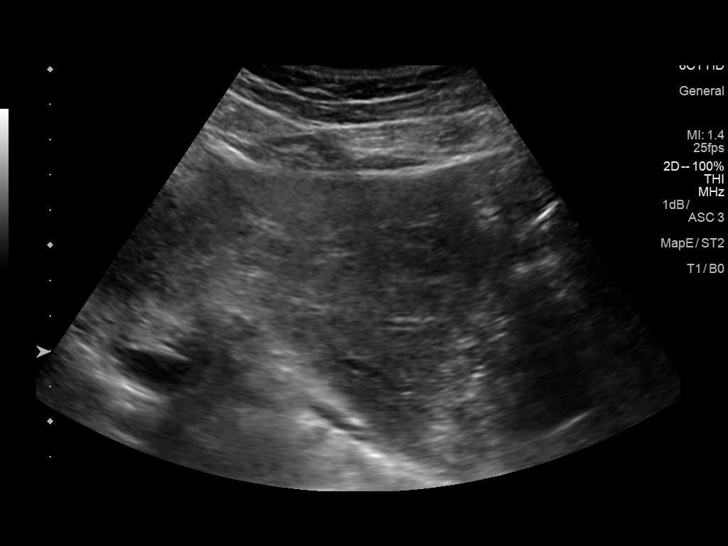
[im 33/40]
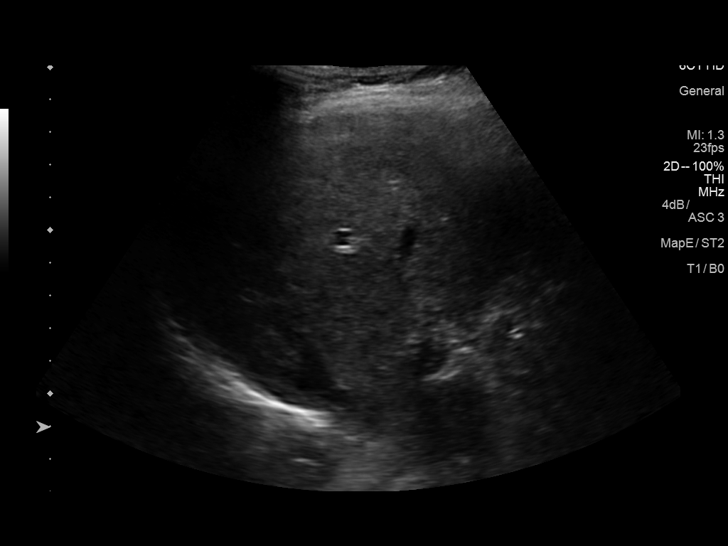
[im 36/40]
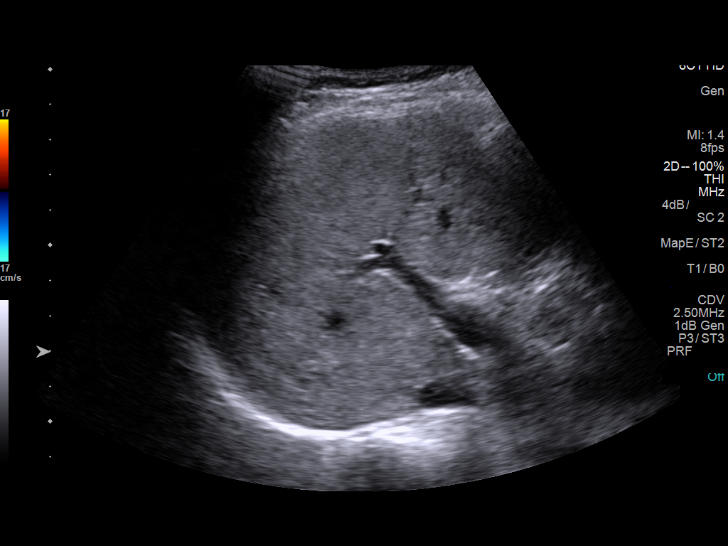
[im 40/40]
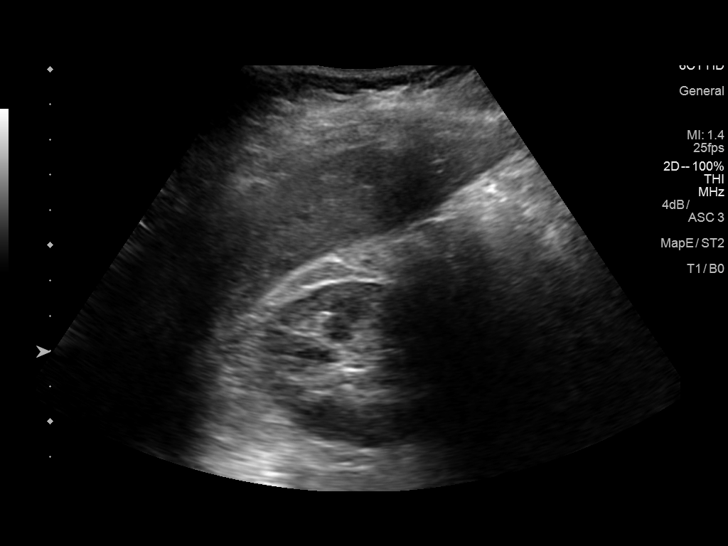

[14 of 25 positions shown; findings below may reference images not displayed]

FINDINGS: Gallbladder:

Normally distended without stones or wall thickening. No
pericholecystic fluid or sonographic Murphy sign.

Common bile duct:

Diameter: 4 mm diameter , normal

Liver:

Normal appearance.  Hepatopetal portal venous flow.

No RIGHT upper quadrant free fluid.
IMPRESSION: Normal exam.

## 2018-05-22 ENCOUNTER — Ambulatory Visit (INDEPENDENT_AMBULATORY_CARE_PROVIDER_SITE_OTHER): Payer: BLUE CROSS/BLUE SHIELD | Admitting: Women's Health

## 2018-05-22 ENCOUNTER — Encounter: Payer: Self-pay | Admitting: Women's Health

## 2018-05-22 VITALS — BP 118/78 | Ht 64.0 in | Wt 162.0 lb

## 2018-05-22 DIAGNOSIS — Z01419 Encounter for gynecological examination (general) (routine) without abnormal findings: Secondary | ICD-10-CM | POA: Diagnosis not present

## 2018-05-22 DIAGNOSIS — Z833 Family history of diabetes mellitus: Secondary | ICD-10-CM

## 2018-05-22 DIAGNOSIS — R87610 Atypical squamous cells of undetermined significance on cytologic smear of cervix (ASC-US): Secondary | ICD-10-CM | POA: Diagnosis not present

## 2018-05-22 LAB — CBC WITH DIFFERENTIAL/PLATELET
Absolute Monocytes: 743 cells/uL (ref 200–950)
BASOS ABS: 38 {cells}/uL (ref 0–200)
Basophils Relative: 0.5 %
EOS ABS: 83 {cells}/uL (ref 15–500)
Eosinophils Relative: 1.1 %
HEMATOCRIT: 41.2 % (ref 35.0–45.0)
HEMOGLOBIN: 13.2 g/dL (ref 11.7–15.5)
Lymphs Abs: 1875 cells/uL (ref 850–3900)
MCH: 26 pg — AB (ref 27.0–33.0)
MCHC: 32 g/dL (ref 32.0–36.0)
MCV: 81.3 fL (ref 80.0–100.0)
MONOS PCT: 9.9 %
MPV: 9.5 fL (ref 7.5–12.5)
NEUTROS ABS: 4763 {cells}/uL (ref 1500–7800)
Neutrophils Relative %: 63.5 %
Platelets: 321 10*3/uL (ref 140–400)
RBC: 5.07 10*6/uL (ref 3.80–5.10)
RDW: 13.8 % (ref 11.0–15.0)
Total Lymphocyte: 25 %
WBC: 7.5 10*3/uL (ref 3.8–10.8)

## 2018-05-22 LAB — GLUCOSE, RANDOM: GLUCOSE: 86 mg/dL (ref 65–99)

## 2018-05-22 NOTE — Addendum Note (Signed)
Addended by: Tito Dine on: 05/22/2018 04:17 PM   Modules accepted: Orders

## 2018-05-22 NOTE — Progress Notes (Signed)
Cassie English 1980-11-25 [id number]    History:    Presents for annual exam.  Irregular bleeding on Micronor, questions what else can be done.  History of migraines without aura but has occasional visual changes with migraines.  Is on Topamax, Maxalt.  Also has anxiety/depression on Lexapro and occasional Xanax per primary care.  Not sexually active for several months, negative GC/chlamydia.  IUD in the past had recurrent BV while in,  none since..  Past medical history, past surgical history, family history and social history were all reviewed and documented in the EPIC chart.  Desk job.  2 daughters ages 75 and 21 both doing well.  Father diabetes.  ROS:  A ROS was performed and pertinent positives and negatives are included.  Exam:  Vitals:   05/22/18 1451  BP: 118/78  Weight: 162 lb (73.5 kg)  Height: 5\' 4"  (1.626 m)   Body mass index is 27.81 kg/m.   General appearance:  Normal Thyroid:  Symmetrical, normal in size, without palpable masses or nodularity. Respiratory  Auscultation:  Clear without wheezing or rhonchi Cardiovascular  Auscultation:  Regular rate, without rubs, murmurs or gallops  Edema/varicosities:  Not grossly evident Abdominal  Soft,nontender, without masses, guarding or rebound.  Liver/spleen:  No organomegaly noted  Hernia:  None appreciated  Skin  Inspection:  Grossly normal   Breasts: Examined lying and sitting.     Right: Without masses, retractions, discharge or axillary adenopathy.     Left: Without masses, retractions, discharge or axillary adenopathy. Gentitourinary   Inguinal/mons:  Normal without inguinal adenopathy  External genitalia:  Normal  BUS/Urethra/Skene's glands:  Normal  Vagina:  Normal  Cervix:  Normal  Uterus:  normal in size, shape and contour.  Midline and mobile  Adnexa/parametria:     Rt: Without masses or tenderness.   Lt: Without masses or tenderness.  Anus and perineum: Normal  Digital rectal exam: Normal  sphincter tone without palpated masses or tenderness  Assessment/Plan:  38 y.o. D WF G4 P2 for annual exam.   Irregular/unpredictable bleeding with Micronor Migraines Topamax and Maxalt per neurologist Anxiety/depression/ADD-primary care manages meds  Plan: Options reviewed we will try Nexplanon, will schedule with Dr. Ellin Saba will check insurance coverage.  Reviewed occasional irregular spotting/bleeding.  Good for 3 years.  Reviewed importance of condoms until permanent partner.  SBEs, annual screening mammogram at 40, calcium rich foods, vitamin D 1000 daily encouraged.  Self-care, leisure activities, and regular exercise encouraged.  Gardasil reviewed, recommended for 40 year old daughter.  Pap with HR HPV typing, new screening guidelines reviewed.  CBC, glucose.   Harrington Challenger [facility], 3:00 PM 05/22/2018

## 2018-05-22 NOTE — Patient Instructions (Addendum)
Health Maintenance, Female Adopting a healthy lifestyle and getting preventive care can go a long way to promote health and wellness. Talk with your health care provider about what schedule of regular examinations is right for you. This is a good chance for you to check in with your provider about disease prevention and staying healthy. In between checkups, there are plenty of things you can do on your own. Experts have done a lot of research about which lifestyle changes and preventive measures are most likely to keep you healthy. Ask your health care provider for more information. Weight and diet Eat a healthy diet  Be sure to include plenty of vegetables, fruits, low-fat dairy products, and lean protein.  Do not eat a lot of foods high in solid fats, added sugars, or salt.  Get regular exercise. This is one of the most important things you can do for your health. ? Most adults should exercise for at least 150 minutes each week. The exercise should increase your heart rate and make you sweat (moderate-intensity exercise). ? Most adults should also do strengthening exercises at least twice a week. This is in addition to the moderate-intensity exercise. Maintain a healthy weight  Body mass index (BMI) is a measurement that can be used to identify possible weight problems. It estimates body fat based on height and weight. Your health care provider can help determine your BMI and help you achieve or maintain a healthy weight.  For females 20 years of age and older: ? A BMI below 18.5 is considered underweight. ? A BMI of 18.5 to 24.9 is normal. ? A BMI of 25 to 29.9 is considered overweight. ? A BMI of 30 and above is considered obese. Watch levels of cholesterol and blood lipids  You should start having your blood tested for lipids and cholesterol at 40 years of age, then have this test every 5 years.  You may need to have your cholesterol levels checked more often if: ? Your lipid or  cholesterol levels are high. ? You are older than 40 years of age. ? You are at high risk for heart disease. Cancer screening Lung Cancer  Lung cancer screening is recommended for adults 55-80 years old who are at high risk for lung cancer because of a history of smoking.  A yearly low-dose CT scan of the lungs is recommended for people who: ? Currently smoke. ? Have quit within the past 15 years. ? Have at least a 30-pack-year history of smoking. A pack year is smoking an average of one pack of cigarettes a day for 1 year.  Yearly screening should continue until it has been 15 years since you quit.  Yearly screening should stop if you develop a health problem that would prevent you from having lung cancer treatment. Breast Cancer  Practice breast self-awareness. This means understanding how your breasts normally appear and feel.  It also means doing regular breast self-exams. Let your health care provider know about any changes, no matter how small.  If you are in your 20s or 30s, you should have a clinical breast exam (CBE) by a health care provider every 1-3 years as part of a regular health exam.  If you are 40 or older, have a CBE every year. Also consider having a breast X-ray (mammogram) every year.  If you have a family history of breast cancer, talk to your health care provider about genetic screening.  If you are at high risk for breast cancer, talk   to your health care provider about having an MRI and a mammogram every year.  Breast cancer gene (BRCA) assessment is recommended for women who have family members with BRCA-related cancers. BRCA-related cancers include: ? Breast. ? Ovarian. ? Tubal. ? Peritoneal cancers.  Results of the assessment will determine the need for genetic counseling and BRCA1 and BRCA2 testing. Cervical Cancer Your health care provider may recommend that you be screened regularly for cancer of the pelvic organs (ovaries, uterus, and vagina).  This screening involves a pelvic examination, including checking for microscopic changes to the surface of your cervix (Pap test). You may be encouraged to have this screening done every 3 years, beginning at age 21.  For women ages 30-65, health care providers may recommend pelvic exams and Pap testing every 3 years, or they may recommend the Pap and pelvic exam, combined with testing for human papilloma virus (HPV), every 5 years. Some types of HPV increase your risk of cervical cancer. Testing for HPV may also be done on women of any age with unclear Pap test results.  Other health care providers may not recommend any screening for nonpregnant women who are considered low risk for pelvic cancer and who do not have symptoms. Ask your health care provider if a screening pelvic exam is right for you.  If you have had past treatment for cervical cancer or a condition that could lead to cancer, you need Pap tests and screening for cancer for at least 20 years after your treatment. If Pap tests have been discontinued, your risk factors (such as having a new sexual partner) need to be reassessed to determine if screening should resume. Some women have medical problems that increase the chance of getting cervical cancer. In these cases, your health care provider may recommend more frequent screening and Pap tests. Colorectal Cancer  This type of cancer can be detected and often prevented.  Routine colorectal cancer screening usually begins at 40 years of age and continues through 40 years of age.  Your health care provider may recommend screening at an earlier age if you have risk factors for colon cancer.  Your health care provider may also recommend using home test kits to check for hidden blood in the stool.  A small camera at the end of a tube can be used to examine your colon directly (sigmoidoscopy or colonoscopy). This is done to check for the earliest forms of colorectal cancer.  Routine  screening usually begins at age 50.  Direct examination of the colon should be repeated every 5-10 years through 40 years of age. However, you may need to be screened more often if early forms of precancerous polyps or small growths are found. Skin Cancer  Check your skin from head to toe regularly.  Tell your health care provider about any new moles or changes in moles, especially if there is a change in a mole's shape or color.  Also tell your health care provider if you have a mole that is larger than the size of a pencil eraser.  Always use sunscreen. Apply sunscreen liberally and repeatedly throughout the day.  Protect yourself by wearing long sleeves, pants, a wide-brimmed hat, and sunglasses whenever you are outside. Heart disease, diabetes, and high blood pressure  High blood pressure causes heart disease and increases the risk of stroke. High blood pressure is more likely to develop in: ? People who have blood pressure in the high end of the normal range (130-139/85-89 mm Hg). ? People   who are overweight or obese. ? People who are African American.  If you are 84-22 years of age, have your blood pressure checked every 3-5 years. If you are 67 years of age or older, have your blood pressure checked every year. You should have your blood pressure measured twice-once when you are at a hospital or clinic, and once when you are not at a hospital or clinic. Record the average of the two measurements. To check your blood pressure when you are not at a hospital or clinic, you can use: ? An automated blood pressure machine at a pharmacy. ? A home blood pressure monitor.  If you are between 52 years and 3 years old, ask your health care provider if you should take aspirin to prevent strokes.  Have regular diabetes screenings. This involves taking a blood sample to check your fasting blood sugar level. ? If you are at a normal weight and have a low risk for diabetes, have this test once  every three years after 40 years of age. ? If you are overweight and have a high risk for diabetes, consider being tested at a younger age or more often. Preventing infection Hepatitis B  If you have a higher risk for hepatitis B, you should be screened for this virus. You are considered at high risk for hepatitis B if: ? You were born in a country where hepatitis B is common. Ask your health care provider which countries are considered high risk. ? Your parents were born in a high-risk country, and you have not been immunized against hepatitis B (hepatitis B vaccine). ? You have HIV or AIDS. ? You use needles to inject street drugs. ? You live with someone who has hepatitis B. ? You have had sex with someone who has hepatitis B. ? You get hemodialysis treatment. ? You take certain medicines for conditions, including cancer, organ transplantation, and autoimmune conditions. Hepatitis C  Blood testing is recommended for: ? Everyone born from [birth year] through 1967. ? Anyone with known risk factors for hepatitis C. Sexually transmitted infections (STIs)  You should be screened for sexually transmitted infections (STIs) including gonorrhea and chlamydia if: ? You are sexually active and are younger than 40 years of age. ? You are older than 40 years of age and your health care provider tells you that you are at risk for this type of infection. ? Your sexual activity has changed since you were last screened and you are at an increased risk for chlamydia or gonorrhea. Ask your health care provider if you are at risk.  If you do not have HIV, but are at risk, it may be recommended that you take a prescription medicine daily to prevent HIV infection. This is called pre-exposure prophylaxis (PrEP). You are considered at risk if: ? You are sexually active and do not regularly use condoms or know the HIV status of your partner(s). ? You take drugs by injection. ? You are sexually active with a partner  who has HIV. Talk with your health care provider about whether you are at high risk of being infected with HIV. If you choose to begin PrEP, you should first be tested for HIV. You should then be tested every 3 months for as long as you are taking PrEP. Pregnancy  If you are premenopausal and you may become pregnant, ask your health care provider about preconception counseling.  If you may become pregnant, take 400 to 800 micrograms (mcg) of folic acid every  day.  If you want to prevent pregnancy, talk to your health care provider about birth control (contraception). Osteoporosis and menopause  Osteoporosis is a disease in which the bones lose minerals and strength with aging. This can result in serious bone fractures. Your risk for osteoporosis can be identified using a bone density scan.  If you are 20 years of age or older, or if you are at risk for osteoporosis and fractures, ask your health care provider if you should be screened.  Ask your health care provider whether you should take a calcium or vitamin D supplement to lower your risk for osteoporosis.  Menopause may have certain physical symptoms and risks.  Hormone replacement therapy may reduce some of these symptoms and risks. Talk to your health care provider about whether hormone replacement therapy is right for you. Follow these instructions at home:  Schedule regular health, dental, and eye exams.  Stay current with your immunizations.  Do not use any tobacco products including cigarettes, chewing tobacco, or electronic cigarettes.  If you are pregnant, do not drink alcohol.  If you are breastfeeding, limit how much and how often you drink alcohol.  Limit alcohol intake to no more than 1 drink per day for nonpregnant women. One drink equals 12 ounces of beer, 5 ounces of wine, or 1 ounces of hard liquor.  Do not use street drugs.  Do not share needles.  Ask your health care provider for help if you need support  or information about quitting drugs.  Tell your health care provider if you often feel depressed.  Tell your health care provider if you have ever been abused or do not feel safe at home. This information is not intended to replace advice given to you by your health care provider. Make sure you discuss any questions you have with your health care provider. Document Released: 09/13/2010 Document Revised: 08/06/2015 Document Reviewed: 12/02/2014 Elsevier Interactive Patient Education  2019 Else[employer] Inc. Human Papillomavirus Quadrivalent Vaccine suspension for injection What is this medicine? HUMAN PAPILLOMAVIRUS VACCINE (HYOO muhn pap uh LOH muh vahy ruhs vak SEEN) is a vaccine. It is used to prevent infections of four types of the human papillomavirus. In women, the vaccine may lower your risk of getting cervical, vaginal, vulvar, or anal cancer and genital warts. In men, the vaccine may lower your risk of getting genital warts and anal cancer. You cannot get these diseases from the vaccine. This vaccine does not treat these diseases. This medicine may be used for other purposes; ask your health care provider or pharmacist if you have questions. COMMON BRAND NAME(S): Gardasil What should I tell my health care provider before I take this medicine? They need to know if you have any of these conditions: -fever or infection -hemophilia -HIV infection or AIDS -immune system problems -low platelet count -an unusual reaction to Human Papillomavirus Vaccine, yeast, other medicines, foods, dyes, or preservatives -pregnant or trying to get pregnant -breast-feeding How should I use this medicine? This vaccine is for injection in a muscle on your upper arm or thigh. It is given by a health care professional. Dennis Bast will be observed for 15 minutes after each dose. Sometimes, fainting happens after the vaccine is given. You may be asked to sit or lie down during the 15 minutes. Three doses are given. The  second dose is given 2 months after the first dose. The last dose is given 4 months after the second dose. A copy of a Vaccine Information Statement  will be given before each vaccination. Read this sheet carefully each time. The sheet may change frequently. Talk to your pediatrician regarding the use of this medicine in children. While this drug may be prescribed for children as Mackenzey Crownover as 89 years of age for selected conditions, precautions do apply. Overdosage: If you think you have taken too much of this medicine contact a poison control center or emergency room at once. NOTE: This medicine is only for you. Do not share this medicine with others. What if I miss a dose? All 3 doses of the vaccine should be given within 6 months. Remember to keep appointments for follow-up doses. Your health care provider will tell you when to return for the next vaccine. Ask your health care professional for advice if you are unable to keep an appointment or miss a scheduled dose. What may interact with this medicine? -other vaccines This list may not describe all possible interactions. Give your health care provider a list of all the medicines, herbs, non-prescription drugs, or dietary supplements you use. Also tell them if you smoke, drink alcohol, or use illegal drugs. Some items may interact with your medicine. What should I watch for while using this medicine? This vaccine may not fully protect everyone. Continue to have regular pelvic exams and cervical or anal cancer screenings as directed by your doctor. The Human Papillomavirus is a sexually transmitted disease. It can be passed by any kind of sexual activity that involves genital contact. The vaccine works best when given before you have any contact with the virus. Many people who have the virus do not have any signs or symptoms. Tell your doctor or health care professional if you have any reaction or unusual symptom after getting the vaccine. What side  effects may I notice from receiving this medicine? Side effects that you should report to your doctor or health care professional as soon as possible: -allergic reactions like skin rash, itching or hives, swelling of the face, lips, or tongue -breathing problems -feeling faint or lightheaded, falls Side effects that usually do not require medical attention (report to your doctor or health care professional if they continue or are bothersome): -cough -dizziness -fever -headache -nausea -redness, warmth, swelling, pain, or itching at site where injected This list may not describe all possible side effects. Call your doctor for medical advice about side effects. You may report side effects to FDA at 1-800-FDA-1088. Where should I keep my medicine? This drug is given in a hospital or clinic and will not be stored at home. NOTE: This sheet is a summary. It may not cover all possible information. If you have questions about this medicine, talk to your doctor, pharmacist, or health care provider.  2019 Elsevier/Gold Standard (2013-04-22 13:14:33) Etonogestrel implant What is this medicine? ETONOGESTREL (et oh noe JES trel) is a contraceptive (birth control) device. It is used to prevent pregnancy. It can be used for up to 3 years. This medicine may be used for other purposes; ask your health care provider or pharmacist if you have questions. COMMON BRAND NAME(S): Implanon, Nexplanon What should I tell my health care provider before I take this medicine? They need to know if you have any of these conditions: -abnormal vaginal bleeding -blood vessel disease or blood clots -breast, cervical, endometrial, ovarian, liver, or uterine cancer -diabetes -gallbladder disease -heart disease or recent heart attack -high blood pressure -high cholesterol or triglycerides -kidney disease -liver disease -migraine headaches -seizures -stroke -tobacco smoker -an unusual or allergic  reaction to  etonogestrel, anesthetics or antiseptics, other medicines, foods, dyes, or preservatives -pregnant or trying to get pregnant -breast-feeding How should I use this medicine? This device is inserted just under the skin on the inner side of your upper arm by a health care professional. Talk to your pediatrician regarding the use of this medicine in children. Special care may be needed. Overdosage: If you think you have taken too much of this medicine contact a poison control center or emergency room at once. NOTE: This medicine is only for you. Do not share this medicine with others. What if I miss a dose? This does not apply. What may interact with this medicine? Do not take this medicine with any of the following medications: -amprenavir -fosamprenavir This medicine may also interact with the following medications: -acitretin -aprepitant -armodafinil -bexarotene -bosentan -carbamazepine -certain medicines for fungal infections like fluconazole, ketoconazole, itraconazole and voriconazole -certain medicines to treat hepatitis, HIV or AIDS -cyclosporine -felbamate -griseofulvin -lamotrigine -modafinil -oxcarbazepine -phenobarbital -phenytoin -primidone -rifabutin -rifampin -rifapentine -St. John's wort -topiramate This list may not describe all possible interactions. Give your health care provider a list of all the medicines, herbs, non-prescription drugs, or dietary supplements you use. Also tell them if you smoke, drink alcohol, or use illegal drugs. Some items may interact with your medicine. What should I watch for while using this medicine? This product does not protect you against HIV infection (AIDS) or other sexually transmitted diseases. You should be able to feel the implant by pressing your fingertips over the skin where it was inserted. Contact your doctor if you cannot feel the implant, and use a non-hormonal birth control method (such as condoms) until your doctor  confirms that the implant is in place. Contact your doctor if you think that the implant may have broken or become bent while in your arm. You will receive a user card from your health care provider after the implant is inserted. The card is a record of the location of the implant in your upper arm and when it should be removed. Keep this card with your health records. What side effects may I notice from receiving this medicine? Side effects that you should report to your doctor or health care professional as soon as possible: -allergic reactions like skin rash, itching or hives, swelling of the face, lips, or tongue -breast lumps, breast tissue changes, or discharge -breathing problems -changes in emotions or moods -if you feel that the implant may have broken or bent while in your arm -high blood pressure -pain, irritation, swelling, or bruising at the insertion site -scar at site of insertion -signs of infection at the insertion site such as fever, and skin redness, pain or discharge -signs and symptoms of a blood clot such as breathing problems; changes in vision; chest pain; severe, sudden headache; pain, swelling, warmth in the leg; trouble speaking; sudden numbness or weakness of the face, arm or leg -signs and symptoms of liver injury like dark yellow or brown urine; general ill feeling or flu-like symptoms; light-colored stools; loss of appetite; nausea; right upper belly pain; unusually weak or tired; yellowing of the eyes or skin -unusual vaginal bleeding, discharge Side effects that usually do not require medical attention (report to your doctor or health care professional if they continue or are bothersome): -acne -breast pain or tenderness -headache -irregular menstrual bleeding -nausea This list may not describe all possible side effects. Call your doctor for medical advice about side effects. You may report side effects  to FDA at 1-800-FDA-1088. Where should I keep my  medicine? This drug is given in a hospital or clinic and will not be stored at home. NOTE: This sheet is a summary. It may not cover all possible information. If you have questions about this medicine, talk to your doctor, pharmacist, or health care provider.  2019 Elsevier/Gold Standard (2017-01-17 14:11:42)

## 2018-05-24 LAB — HUMAN PAPILLOMAVIRUS, HIGH RISK: HPV DNA High Risk: DETECTED — AB

## 2018-05-24 LAB — PAP IG W/ RFLX HPV ASCU

## 2018-05-28 DIAGNOSIS — J209 Acute bronchitis, unspecified: Secondary | ICD-10-CM | POA: Diagnosis not present

## 2018-06-06 DIAGNOSIS — F411 Generalized anxiety disorder: Secondary | ICD-10-CM | POA: Diagnosis not present

## 2018-06-06 DIAGNOSIS — F900 Attention-deficit hyperactivity disorder, predominantly inattentive type: Secondary | ICD-10-CM | POA: Diagnosis not present

## 2018-06-06 DIAGNOSIS — G43009 Migraine without aura, not intractable, without status migrainosus: Secondary | ICD-10-CM | POA: Diagnosis not present

## 2018-06-06 DIAGNOSIS — E663 Overweight: Secondary | ICD-10-CM | POA: Diagnosis not present

## 2018-06-27 ENCOUNTER — Ambulatory Visit: Payer: BLUE CROSS/BLUE SHIELD | Admitting: Obstetrics & Gynecology

## 2018-12-14 DIAGNOSIS — F411 Generalized anxiety disorder: Secondary | ICD-10-CM | POA: Diagnosis not present

## 2018-12-14 DIAGNOSIS — G43009 Migraine without aura, not intractable, without status migrainosus: Secondary | ICD-10-CM | POA: Diagnosis not present

## 2018-12-14 DIAGNOSIS — F900 Attention-deficit hyperactivity disorder, predominantly inattentive type: Secondary | ICD-10-CM | POA: Diagnosis not present

## 2019-01-18 DIAGNOSIS — Z1159 Encounter for screening for other viral diseases: Secondary | ICD-10-CM | POA: Diagnosis not present

## 2019-01-24 DIAGNOSIS — Z1159 Encounter for screening for other viral diseases: Secondary | ICD-10-CM | POA: Diagnosis not present

## 2019-01-28 DIAGNOSIS — Z03818 Encounter for observation for suspected exposure to other biological agents ruled out: Secondary | ICD-10-CM | POA: Diagnosis not present

## 2019-03-01 ENCOUNTER — Other Ambulatory Visit: Payer: Self-pay

## 2019-03-04 ENCOUNTER — Ambulatory Visit (INDEPENDENT_AMBULATORY_CARE_PROVIDER_SITE_OTHER): Payer: BC Managed Care – PPO | Admitting: Women's Health

## 2019-03-04 ENCOUNTER — Encounter: Payer: Self-pay | Admitting: Women's Health

## 2019-03-04 ENCOUNTER — Other Ambulatory Visit: Payer: Self-pay

## 2019-03-04 VITALS — BP 120/82

## 2019-03-04 DIAGNOSIS — N912 Amenorrhea, unspecified: Secondary | ICD-10-CM

## 2019-03-04 DIAGNOSIS — Z113 Encounter for screening for infections with a predominantly sexual mode of transmission: Secondary | ICD-10-CM | POA: Diagnosis not present

## 2019-03-04 LAB — PREGNANCY, URINE: Preg Test, Ur: NEGATIVE

## 2019-03-04 LAB — WET PREP FOR TRICH, YEAST, CLUE

## 2019-03-04 MED ORDER — NORETHINDRONE 0.35 MG PO TABS
1.0000 | ORAL_TABLET | Freq: Every day | ORAL | 0 refills | Status: DC
Start: 2019-03-04 — End: 2019-07-11

## 2019-03-04 MED ORDER — METRONIDAZOLE 500 MG PO TABS: 500.0000 mg | ORAL_TABLET | Freq: Two times a day (BID) | ORAL | 0 refills | Status: DC
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
## 2019-03-04 NOTE — Patient Instructions (Signed)
Cassie English at [facility]  [phone] Bacterial Vaginosis  Bacterial vaginosis is a vaginal infection that occurs when the normal balance of bacteria in the vagina is disrupted. It results from an overgrowth of certain bacteria. This is the most common vaginal infection among women ages 43-44. Because bacterial vaginosis increases your risk for STIs (sexually transmitted infections), getting treated can help reduce your risk for chlamydia, gonorrhea, herpes, and HIV (human immunodeficiency virus). Treatment is also important for preventing complications in pregnant women, because this condition can cause an early (premature) delivery. What are the causes? This condition is caused by an increase in harmful bacteria that are normally present in small amounts in the vagina. However, the reason that the condition develops is not fully understood. What increases the risk? The following factors may make you more likely to develop this condition:  Having a new sexual partner or multiple sexual partners.  Having unprotected sex.  Douching.  Having an intrauterine device (IUD).  Smoking.  Drug and alcohol abuse.  Taking certain antibiotic medicines.  Being pregnant. You cannot get bacterial vaginosis from toilet seats, bedding, swimming pools, or contact with objects around you. What are the signs or symptoms? Symptoms of this condition include:  Grey or white vaginal discharge. The discharge can also be watery or foamy.  A fish-like odor with discharge, especially after sexual intercourse or during menstruation.  Itching in and around the vagina.  Burning or pain with urination. Some women with bacterial vaginosis have no signs or symptoms. How is this diagnosed? This condition is diagnosed based on:  Your medical history.  A physical exam of the vagina.  Testing a sample of vaginal fluid under a microscope to look for a large amount of bad bacteria or abnormal cells. Your health  care provider may use a cotton swab or a small wooden spatula to collect the sample. How is this treated? This condition is treated with antibiotics. These may be given as a pill, a vaginal cream, or a medicine that is put into the vagina (suppository). If the condition comes back after treatment, a second round of antibiotics may be needed. Follow these instructions at home: Medicines  Take over-the-counter and prescription medicines only as told by your health care provider.  Take or use your antibiotic as told by your health care provider. Do not stop taking or using the antibiotic even if you start to feel better. General instructions  If you have a female sexual partner, tell her that you have a vaginal infection. She should see her health care provider and be treated if she has symptoms. If you have a female sexual partner, he does not need treatment.  During treatment: ? Avoid sexual activity until you finish treatment. ? Do not douche. ? Avoid alcohol as directed by your health care provider. ? Avoid breastfeeding as directed by your health care provider.  Drink enough water and fluids to keep your urine clear or pale yellow.  Keep the area around your vagina and rectum clean. ? Wash the area daily with warm water. ? Wipe yourself from front to back after using the toilet.  Keep all follow-up visits as told by your health care provider. This is important. How is this prevented?  Do not douche.  Wash the outside of your vagina with warm water only.  Use protection when having sex. This includes latex condoms and dental dams.  Limit how many sexual partners you have. To help prevent bacterial vaginosis, it is best to  have sex with just one partner (monogamous).  Make sure you and your sexual partner are tested for STIs.  Wear cotton or cotton-lined underwear.  Avoid wearing tight pants and pantyhose, especially during summer.  Limit the amount of alcohol that you  drink.  Do not use any products that contain nicotine or tobacco, such as cigarettes and e-cigarettes. If you need help quitting, ask your health care provider.  Do not use illegal drugs. Where to find more information  Centers for Disease Control and Prevention: SolutionApps.co.za  American Sexual Health Association (ASHA): www.ashastd.org  U.S. Department of Health and Health and safety inspector, Office on Women's Health: ConventionalMedicines.si or http://www.anderson-williamson.info/ Contact a health care provider if:  Your symptoms do not improve, even after treatment.  You have more discharge or pain when urinating.  You have a fever.  You have pain in your abdomen.  You have pain during sex.  You have vaginal bleeding between periods. Summary  Bacterial vaginosis is a vaginal infection that occurs when the normal balance of bacteria in the vagina is disrupted.  Because bacterial vaginosis increases your risk for STIs (sexually transmitted infections), getting treated can help reduce your risk for chlamydia, gonorrhea, herpes, and HIV (human immunodeficiency virus). Treatment is also important for preventing complications in pregnant women, because the condition can cause an early (premature) delivery.  This condition is treated with antibiotic medicines. These may be given as a pill, a vaginal cream, or a medicine that is put into the vagina (suppository). This information is not intended to replace advice given to you by your health care provider. Make sure you discuss any questions you have with your health care provider. Document Released: 02/28/2005 Document Revised: 02/10/2017 Document Reviewed: 11/14/2015 Elsevier Patient Education  2020 [employer].

## 2019-03-05 LAB — RPR: RPR Ser Ql: NONREACTIVE

## 2019-03-05 LAB — HIV ANTIBODY (ROUTINE TESTING W REFLEX): HIV 1&2 Ab, 4th Generation: NONREACTIVE

## 2019-03-06 LAB — GC PROBE AMP THINPREP: N. gonorrhoeae RNA, TMA: NOT DETECTED

## 2019-03-06 LAB — CHLAMYDIA PROBE AMP THINPREP: C. trachomatis RNA, TMA: NOT DETECTED

## 2019-05-10 ENCOUNTER — Other Ambulatory Visit: Payer: Self-pay

## 2019-05-10 NOTE — Telephone Encounter (Signed)
I received a request for a new prescription for Xanax from a mail order pharmacy. This really needs to be a local pharmacy Rx as it is not a daily long term Rx that is usually sent to mail order.   I do not see where [place] has ever prescribed this medication. Patient is due for annual in March. I sent note back to pharmacy to have patient call us to schedule an annual visit and can discuss medication.

## 2019-06-05 ENCOUNTER — Telehealth: Payer: Self-pay | Admitting: General Practice

## 2019-06-05 NOTE — Telephone Encounter (Signed)
Received request for pt to establish with office. Lvm asking pt to call office regarding this.

## 2019-07-05 ENCOUNTER — Ambulatory Visit: Payer: BLUE CROSS/BLUE SHIELD | Admitting: Family Medicine

## 2019-07-05 DIAGNOSIS — Z0289 Encounter for other administrative examinations: Secondary | ICD-10-CM

## 2019-07-10 ENCOUNTER — Other Ambulatory Visit: Payer: Self-pay

## 2019-07-10 NOTE — Telephone Encounter (Signed)
Spoke with patient she is scheduled to see Tiffany tomorrow.

## 2019-07-11 ENCOUNTER — Encounter: Payer: Self-pay | Admitting: Nurse Practitioner

## 2019-07-11 ENCOUNTER — Ambulatory Visit: Payer: BC Managed Care – PPO | Admitting: Nurse Practitioner

## 2019-07-11 VITALS — BP 112/78 | Ht 63.0 in | Wt 159.8 lb

## 2019-07-11 DIAGNOSIS — R8789 Other abnormal findings in specimens from female genital organs: Secondary | ICD-10-CM

## 2019-07-11 DIAGNOSIS — E7800 Pure hypercholesterolemia, unspecified: Secondary | ICD-10-CM | POA: Diagnosis not present

## 2019-07-11 DIAGNOSIS — Z01419 Encounter for gynecological examination (general) (routine) without abnormal findings: Secondary | ICD-10-CM

## 2019-07-11 DIAGNOSIS — G43709 Chronic migraine without aura, not intractable, without status migrainosus: Secondary | ICD-10-CM

## 2019-07-11 DIAGNOSIS — R87618 Other abnormal cytological findings on specimens from cervix uteri: Secondary | ICD-10-CM

## 2019-07-11 DIAGNOSIS — E282 Polycystic ovarian syndrome: Secondary | ICD-10-CM | POA: Diagnosis not present

## 2019-07-11 DIAGNOSIS — F419 Anxiety disorder, unspecified: Secondary | ICD-10-CM

## 2019-07-11 LAB — CBC WITH DIFFERENTIAL/PLATELET
Absolute Monocytes: 612 cells/uL (ref 200–950)
Basophils Absolute: 18 cells/uL (ref 0–200)
Basophils Relative: 0.4 %
Eosinophils Absolute: 78 cells/uL (ref 15–500)
Eosinophils Relative: 1.7 %
HCT: 36.7 % (ref 35.0–45.0)
Hemoglobin: 12 g/dL (ref 11.7–15.5)
Lymphs Abs: 1251 cells/uL (ref 850–3900)
MCH: 28 pg (ref 27.0–33.0)
MCHC: 32.7 g/dL (ref 32.0–36.0)
MCV: 85.5 fL (ref 80.0–100.0)
MPV: 9.7 fL (ref 7.5–12.5)
Monocytes Relative: 13.3 %
Neutro Abs: 2640 cells/uL (ref 1500–7800)
Neutrophils Relative %: 57.4 %
Platelets: 231 10*3/uL (ref 140–400)
RBC: 4.29 10*6/uL (ref 3.80–5.10)
RDW: 12.8 % (ref 11.0–15.0)
Total Lymphocyte: 27.2 %
WBC: 4.6 10*3/uL (ref 3.8–10.8)

## 2019-07-11 LAB — LIPID PANEL
Cholesterol: 179 mg/dL (ref <200)
HDL: 45 mg/dL — ABNORMAL LOW (ref >=50)
LDL Cholesterol (Calc): 118 mg/dL (calc) — ABNORMAL HIGH
Non-HDL Cholesterol (Calc): 134 mg/dL (calc) — ABNORMAL HIGH (ref <130)
Total CHOL/HDL Ratio: 4 (calc) (ref <5.0)
Triglycerides: 65 mg/dL (ref <150)

## 2019-07-11 LAB — COMPREHENSIVE METABOLIC PANEL
AG Ratio: 1.8 (calc) (ref 1.0–2.5)
ALT: 6 U/L (ref 6–29)
AST: 10 U/L (ref 10–30)
Albumin: 4.2 g/dL (ref 3.6–5.1)
Alkaline phosphatase (APISO): 64 U/L (ref 31–125)
BUN: 16 mg/dL (ref 7–25)
CO2: 22 mmol/L (ref 20–32)
Calcium: 9.1 mg/dL (ref 8.6–10.2)
Chloride: 110 mmol/L (ref 98–110)
Creat: 0.66 mg/dL (ref 0.50–1.10)
Globulin: 2.3 g/dL (calc) (ref 1.9–3.7)
Glucose, Bld: 77 mg/dL (ref 65–99)
Potassium: 3.8 mmol/L (ref 3.5–5.3)
Sodium: 140 mmol/L (ref 135–146)
Total Bilirubin: 0.5 mg/dL (ref 0.2–1.2)
Total Protein: 6.5 g/dL (ref 6.1–8.1)

## 2019-07-11 MED ORDER — NORETHINDRONE 0.35 MG PO TABS: 1.0000 | ORAL_TABLET | Freq: Every day | ORAL | 0 refills | Status: DC

## 2019-07-11 MED ORDER — TOPIRAMATE 50 MG PO TABS: 100.0000 mg | ORAL_TABLET | Freq: Every day | ORAL | 2 refills | Status: DC

## 2019-07-11 MED ORDER — ESCITALOPRAM OXALATE 10 MG PO TABS: 10.0000 mg | ORAL_TABLET | Freq: Every day | ORAL | 0 refills | Status: DC

## 2019-07-11 MED ORDER — FLUOXETINE HCL 20 MG PO CAPS: ORAL_CAPSULE | ORAL | 2 refills | Status: DC

## 2019-07-11 NOTE — Patient Instructions (Addendum)
Stop Lexapro, bring Prozac  Find PCP Follow up in 1 year for annual   Health Maintenance, Female Adopting a healthy lifestyle and getting preventive care are important in promoting health and wellness. Ask your health care provider about:  The right schedule for you to have regular tests and exams.  Things you can do on your own to prevent diseases and keep yourself healthy. What should I know about diet, weight, and exercise? Eat a healthy diet   Eat a diet that includes plenty of vegetables, fruits, low-fat dairy products, and lean protein.  Do not eat a lot of foods that are high in solid fats, added sugars, or sodium. Maintain a healthy weight Body mass index (BMI) is used to identify weight problems. It estimates body fat based on height and weight. Your health care provider can help determine your BMI and help you achieve or maintain a healthy weight. Get regular exercise Get regular exercise. This is one of the most important things you can do for your health. Most adults should:  Exercise for at least 150 minutes each week. The exercise should increase your heart rate and make you sweat (moderate-intensity exercise).  Do strengthening exercises at least twice a week. This is in addition to the moderate-intensity exercise.  Spend less time sitting. Even light physical activity can be beneficial. Watch cholesterol and blood lipids Have your blood tested for lipids and cholesterol at 41 years of age, then have this test every 5 years. Have your cholesterol levels checked more often if:  Your lipid or cholesterol levels are high.  You are older than 41 years of age.  You are at high risk for heart disease. What should I know about cancer screening? Depending on your health history and family history, you may need to have cancer screening at various ages. This may include screening for:  Breast cancer.  Cervical cancer.  Colorectal cancer.  Skin cancer.  Lung  cancer. What should I know about heart disease, diabetes, and high blood pressure? Blood pressure and heart disease  High blood pressure causes heart disease and increases the risk of stroke. This is more likely to develop in people who have high blood pressure readings, are of African descent, or are overweight.  Have your blood pressure checked: ? Every 3-5 years if you are 56-13 years of age. ? Every year if you are 45 years old or older. Diabetes Have regular diabetes screenings. This checks your fasting blood sugar level. Have the screening done:  Once every three years after age 90 if you are at a normal weight and have a low risk for diabetes.  More often and at a younger age if you are overweight or have a high risk for diabetes. What should I know about preventing infection? Hepatitis B If you have a higher risk for hepatitis B, you should be screened for this virus. Talk with your health care provider to find out if you are at risk for hepatitis B infection. Hepatitis C Testing is recommended for:  Everyone born from [birth year] through 1967.  Anyone with known risk factors for hepatitis C. Sexually transmitted infections (STIs)  Get screened for STIs, including gonorrhea and chlamydia, if: ? You are sexually active and are younger than 41 years of age. ? You are older than 41 years of age and your health care provider tells you that you are at risk for this type of infection. ? Your sexual activity has changed since you were last screened,  and you are at increased risk for chlamydia or gonorrhea. Ask your health care provider if you are at risk.  Ask your health care provider about whether you are at high risk for HIV. Your health care provider may recommend a prescription medicine to help prevent HIV infection. If you choose to take medicine to prevent HIV, you should first get tested for HIV. You should then be tested every 3 months for as long as you are taking the  medicine. Pregnancy  If you are about to stop having your period (premenopausal) and you may become pregnant, seek counseling before you get pregnant.  Take 400 to 800 micrograms (mcg) of folic acid every day if you become pregnant.  Ask for birth control (contraception) if you want to prevent pregnancy. Osteoporosis and menopause Osteoporosis is a disease in which the bones lose minerals and strength with aging. This can result in bone fractures. If you are 43 years old or older, or if you are at risk for osteoporosis and fractures, ask your health care provider if you should:  Be screened for bone loss.  Take a calcium or vitamin D supplement to lower your risk of fractures.  Be given hormone replacement therapy (HRT) to treat symptoms of menopause. Follow these instructions at home: Lifestyle  Do not use any products that contain nicotine or tobacco, such as cigarettes, e-cigarettes, and chewing tobacco. If you need help quitting, ask your health care provider.  Do not use street drugs.  Do not share needles.  Ask your health care provider for help if you need support or information about quitting drugs. Alcohol use  Do not drink alcohol if: ? Your health care provider tells you not to drink. ? You are pregnant, may be pregnant, or are planning to become pregnant.  If you drink alcohol: ? Limit how much you use to 0-1 drink a day. ? Limit intake if you are breastfeeding.  Be aware of how much alcohol is in your drink. In the [place] one drink equals one 12 oz bottle of beer (355 mL), one 5 oz glass of wine (148 mL), or one 1 oz glass of hard liquor (44 mL). General instructions  Schedule regular health, dental, and eye exams.  Stay current with your vaccines.  Tell your health care provider if: ? You often feel depressed. ? You have ever been abused or do not feel safe at home. Summary  Adopting a healthy lifestyle and getting preventive care are important in  promoting health and wellness.  Follow your health care provider's instructions about healthy diet, exercising, and getting tested or screened for diseases.  Follow your health care provider's instructions on monitoring your cholesterol and blood pressure. This information is not intended to replace advice given to you by your health care provider. Make sure you discuss any questions you have with your health care provider. Document Revised: 02/21/2018 Document Reviewed: 02/21/2018 Elsevier Patient Education  2020 [employer].

## 2019-07-11 NOTE — Progress Notes (Signed)
   Cassie English 1980/08/21 [id number]   History:  39 y.o. G2 P2 presents for annual exam and medication refill requests. Was let go from job and will be losing insurance in a couple of days. Positive pap March 2020 with recommendations for colposcopy and biopsy but was never done by patient. History of PCOS-taking Micronor, having regular cycles, migraines-controlled with maintenance dose of Topamax and Maxalt prn, generalized anxiety disorder-taking Lexapro but says she did better when she was on Prozac in the past and would like to try this again.  Does not currently see a PCP. Sexually active with partner, no need for STD screening per patient.   Gynecologic History Patient's last menstrual period was 06/25/2019. Period Duration (Days): 4 DAYS Period Pattern: Regular Menstrual Flow: Light Menstrual Control: Tampon Menstrual Control Change Freq (Hours): CHANGES EVERY 3 HOURS Dysmenorrhea: (!) Moderate Dysmenorrhea Symptoms: Cramping, Headache Contraception: oral progesterone-only contraceptive Last Pap: 05/22/18. Results were: +ASCUS with + HR HPV Last mammogram: never  Past medical history, past surgical history, family history and social history were all reviewed and documented in the EPIC chart. Lost job at [employer] due to pandemic, 2 daughters.   ROS:  A ROS was performed and pertinent positives and negatives are included.  Exam:  Vitals:   07/11/19 1211  BP: 112/78  Weight: 159 lb 12.8 oz (72.5 kg)  Height: 5\' 3"  (1.6 m)   Body mass index is 28.31 kg/m.  General appearance:  Normal Thyroid:  Symmetrical, normal in size, without palpable masses or nodularity. Respiratory  Auscultation:  Clear without wheezing or rhonchi Cardiovascular  Auscultation:  Regular rate, without rubs, murmurs or gallops  Edema/varicosities:  Not grossly evident Abdominal  Soft,nontender, without masses, guarding or rebound.  Liver/spleen:  No organomegaly noted  Hernia:  None  appreciated  Skin  Inspection:  Grossly normal   Breasts: Examined lying and sitting.   Right: Without masses, retractions, discharge or axillary adenopathy.   Left: Without masses, retractions, discharge or axillary adenopathy. Gentitourinary   Inguinal/mons:  Normal without inguinal adenopathy  External genitalia:  Normal  BUS/Urethra/Skene's glands:  Normal  Vagina:  Normal  Cervix:  Normal  Uterus:  Normal in size, shape and contour.  Midline and mobile  Adnexa/parametria:     Rt: Without masses or tenderness.   Lt: Without masses or tenderness.  Anus and perineum: Normal   Assessment/Plan:  38 y.o.  for annual exam and mediation refills.   1.Well woman exam with routine gynecological exam: Educated on SBEs, current guidelines and importance of screenings, high calcium diet, regular exercise, and daily multivitamin. Encouraged to schedule mammogram once she obtains insurance coverage. Pap pending. Labs today: CBC, CMP, and lipid panel.  Refills sent.  Instructed to find PCP for management of chronic conditions going further.   2.Abnormal Papanicolaou smear of cervix with positive human papilloma virus (HPV) test - 05/22/2018, pt did not follow up as recommended. Will repeat today  3.PCOS (polycystic ovarian syndrome)-Continue micronor  4.Chronic migraine without aura without status migrainosus, not intractable-continue maintenance dose of Topamax and prn Maxalt. Refill provided on Topamax.  Generalized anxiety disorder-Stop Lexapro, being Prozac 20 mg daily, increase to 20 mg twice a day during menses as this was how she took them in the past and this worked well.   Follow up in 1 year for annual         07/22/2018 [facility], 12:17 PM 07/11/2019

## 2019-07-12 LAB — PAP, TP IMAGING W/ HPV RNA, RFLX HPV TYPE 16,18/45: HPV DNA High Risk: DETECTED — AB

## 2019-10-01 ENCOUNTER — Telehealth: Payer: Self-pay

## 2019-10-01 DIAGNOSIS — G43709 Chronic migraine without aura, not intractable, without status migrainosus: Secondary | ICD-10-CM

## 2019-10-01 MED ORDER — TOPIRAMATE 50 MG PO TABS: 100.0000 mg | ORAL_TABLET | Freq: Every day | ORAL | 1 refills | Status: DC

## 2019-10-01 NOTE — Telephone Encounter (Signed)
Rx sent 

## 2019-10-01 NOTE — Telephone Encounter (Signed)
That's fine. Please update and send to her pharmacy. Thank you!

## 2019-10-01 NOTE — Telephone Encounter (Signed)
Pharmacy sent a note regarding Topiramate 50 mg. Tabs.  "The patient is requesting authorization to dispense a 90 days supply. Original quantity prescribed was #90. Quantity requested is #368."

## 2019-10-08 ENCOUNTER — Ambulatory Visit: Payer: BC Managed Care – PPO | Admitting: Obstetrics & Gynecology

## 2019-10-08 DIAGNOSIS — Z0289 Encounter for other administrative examinations: Secondary | ICD-10-CM

## 2021-11-29 NOTE — H&P (Signed)
PREOPERATIVE H&P  Chief Complaint: right ankle pain  HPI: Cassie English is a 41 y.o. female who presents for preoperative history and physical with a diagnosis of right distal fibula fracture. On  11/25/21 she fell while coming out of her building at work. She had severe pain to the right ankle and was unable to put weight on her ankle. She presented  to her PCP office where x-rays were taken and she was sent to our office for follow up. X-rays showed a displaced right distal fibula fracture. Symptoms are rated as moderate to severe, and have been worsening.  This is significantly impairing activities of daily living.  She has elected for surgical management.   Past Medical History:  Diagnosis Date   ADD (attention deficit disorder)    Anemia    GAD (generalized anxiety disorder)    GERD (gastroesophageal reflux disease)    Migraine    Past Surgical History:  Procedure Laterality Date   CERVICAL CERCLAGE     WISDOM TOOTH EXTRACTION     Social History   Socioeconomic History   Marital status: Divorced    Spouse name: Not on file   Number of children: Not on file   Years of education: Not on file   Highest education level: Not on file  Occupational History   Not on file  Tobacco Use   Smoking status: Never   Smokeless tobacco: Never  Vaping Use   Vaping Use: Never used  Substance and Sexual Activity   Alcohol use: English    Alcohol/week: 0.0 standard drinks of alcohol   Drug use: English   Sexual activity: Not Currently    Birth control/protection: Condom    Comment: INTERCOURSE AGE 61, SEXUAL APRTNERS MORE THAN 5  Other Topics Concern   Not on file  Social History Narrative   Not on file   Social Determinants of Health   Financial Resource Strain: Not on file  Food Insecurity: Not on file  Transportation Needs: Not on file  Physical Activity: Not on file  Stress: Not on file  Social Connections: Not on file   Family History  Problem Relation Age of Onset   Diabetes  Father    Aneurysm Father    Dementia Maternal Grandfather    Allergies  Allergen Reactions   Codeine Itching   Nubain [Nalbuphine Hcl] Rash   Phenergan [Promethazine Hcl] Rash   Prior to Admission medications   Medication Sig Start Date End Date Taking? Authorizing Provider  acetaminophen (TYLENOL) 500 MG tablet Take 1,000 mg by mouth every 6 (six) hours as needed for moderate pain.   Yes [provider]  FLUoxetine (PROZAC) 20 MG capsule Take 1 capsule daily, 2 capsules during menses Patient taking differently: Take 20 mg by mouth at bedtime. 07/11/19  Yes Marny Lowenstein A, NP  norethindrone (ORTHO MICRONOR) 0.35 MG tablet Take 1 tablet (0.35 mg total) by mouth daily. 07/11/19  Yes Marny Lowenstein A, NP  rizatriptan (MAXALT) 10 MG tablet Take 10 mg by mouth as needed for migraine. May repeat in 2 hours if needed   Yes [provider]  topiramate (TOPAMAX) 50 MG tablet Take 2-4 tablets (100-200 mg total) by mouth daily. Patient taking differently: Take 150 mg by mouth at bedtime. 10/01/19  Yes Marny Lowenstein A, NP     Positive ROS: All other systems have been reviewed and were otherwise negative with the exception of those mentioned in the HPI and as above.  Physical Exam: General:  Alert, English acute distress Cardiovascular: English pedal edema Respiratory: English cyanosis, English use of accessory musculature GI: English organomegaly, abdomen is soft and non-tender Skin: English lesions in the area of chief complaint Neurologic: Sensation intact distally Psychiatric: Patient is competent for consent with normal mood and affect Lymphatic: English axillary or cervical lymphadenopathy  MUSCULOSKELETAL: Severe TTP to lateral malleolus. Able to flex and extend all toes. Moderate edema to lateral right ankle. + DP pulse.   X-rays show displaced right distal fibula fracture  Assessment: Right distal fibula fracture   Plan: Plan for Procedure(s): OPEN REDUCTION INTERNAL FIXATION (ORIF)  FIBULA FRACTURE  The risks benefits and alternatives were discussed with the patient including but not limited to the risks of nonoperative treatment, versus surgical intervention including infection, bleeding, nerve injury,  blood clots, cardiopulmonary complications, morbidity, mortality, among others, and they were willing to proceed.      Ventura Bruns, PA-C  12/01/2021 5:16 PM

## 2021-11-29 NOTE — Progress Notes (Signed)
Surgery orders requested via Epic inbox. °

## 2021-11-30 NOTE — Patient Instructions (Signed)
DUE TO COVID-19 ONLY TWO VISITORS  (aged 43 and older)  ARE ALLOWED TO COME WITH YOU AND STAY IN THE WAITING ROOM ONLY DURING PRE OP AND PROCEDURE.   **NO VISITORS ARE ALLOWED IN THE SHORT STAY AREA OR RECOVERY ROOM!!**  IF YOU WILL BE ADMITTED INTO THE HOSPITAL YOU ARE ALLOWED ONLY FOUR SUPPORT PEOPLE DURING VISITATION HOURS ONLY (7 AM -8PM)   The support person(s) must pass our screening, gel in and out, and wear a mask at all times, including in the patient's room. Patients must also wear a mask when staff or their support person are in the room. Visitors GUEST BADGE MUST BE WORN VISIBLY  One adult visitor may remain with you overnight and MUST be in the room by 8 P.M.     Your procedure is scheduled on: 12/07/21   Report to [facility] Main Entrance    Report to admitting at : 11:30 AM   Call this number if you have problems the morning of surgery [phone]   Do not eat food :After Midnight.   After Midnight you may have the following liquids until: 10:30 AM DAY OF SURGERY  Water Black Coffee (sugar ok, NO MILK/CREAM OR CREAMERS)  Tea (sugar ok, NO MILK/CREAM OR CREAMERS) regular and decaf                             Plain Jell-O (NO RED)                                           Fruit ices (not with fruit pulp, NO RED)                                     Popsicles (NO RED)                                                                  Juice: apple, WHITE grape, WHITE cranberry Sports drinks like Gatorade (NO RED)              Drink Ensure drink  AT :10:30 AM the day of surgery.     The day of surgery:  Drink ONE (1) Pre-Surgery Clear Ensure or G2 at AM the morning of surgery. Drink in one sitting. Do not sip.  This drink was given to you during your hospital  pre-op appointment visit. Nothing else to drink after completing the  Pre-Surgery Clear Ensure or G2.          If you have questions, please contact your surgeon's office.    Oral Hygiene is also  important to reduce your risk of infection.                                    Remember - BRUSH YOUR TEETH THE MORNING OF SURGERY WITH YOUR REGULAR TOOTHPASTE   Do NOT smoke after Midnight   Take these medicines the morning of surgery with A SIP  OF WATER: rizatriptan.  DO NOT TAKE ANY ORAL DIABETIC MEDICATIONS DAY OF YOUR SURGERY  Bring CPAP mask and tubing day of surgery.                              You may not have any metal on your body including hair pins, jewelry, and body piercing             Do not wear make-up, lotions, powders, perfumes/cologne, or deodorant  Do not wear nail polish including gel and S&S, artificial/acrylic nails, or any other type of covering on natural nails including finger and toenails. If you have artificial nails, gel coating, etc. that needs to be removed by a nail salon please have this removed prior to surgery or surgery may need to be canceled/ delayed if the surgeon/ anesthesia feels like they are unable to be safely monitored.   Do not shave  48 hours prior to surgery.   Do not bring valuables to the hospital. [facility] IS NOT             RESPONSIBLE   FOR VALUABLES.   Contacts, dentures or bridgework may not be worn into surgery.   Bring small overnight bag day of surgery.   DO NOT BRING YOUR HOME MEDICATIONS TO THE HOSPITAL. PHARMACY WILL DISPENSE MEDICATIONS LISTED ON YOUR MEDICATION LIST TO YOU DURING YOUR ADMISSION IN THE HOSPITAL!    Patients discharged on the day of surgery will not be allowed to drive home.  Someone NEEDS to stay with you for the first 24 hours after anesthesia.   Special Instructions: Bring a copy of your healthcare power of attorney and living will documents         the day of surgery if you haven't scanned them before.              Please read over the following fact sheets you were given: IF YOU HAVE QUESTIONS ABOUT YOUR PRE-OP INSTRUCTIONS PLEASE CALL [phone]     [facility] - Preparing for Surgery Before  surgery, you can play an important role.  Because skin is not sterile, your skin needs to be as free of germs as possible.  You can reduce the number of germs on your skin by washing with CHG (chlorahexidine gluconate) soap before surgery.  CHG is an antiseptic cleaner which kills germs and bonds with the skin to continue killing germs even after washing. Please DO NOT use if you have an allergy to CHG or antibacterial soaps.  If your skin becomes reddened/irritated stop using the CHG and inform your nurse when you arrive at Short Stay. Do not shave (including legs and underarms) for at least 48 hours prior to the first CHG shower.  You may shave your face/neck. Please follow these instructions carefully:  1.  Shower with CHG Soap the night before surgery and the  morning of Surgery.  2.  If you choose to wash your hair, wash your hair first as usual with your  normal  shampoo.  3.  After you shampoo, rinse your hair and body thoroughly to remove the  shampoo.                           4.  Use CHG as you would any other liquid soap.  You can apply chg directly  to the skin and wash  Gently with a scrungie or clean washcloth.  5.  Apply the CHG Soap to your body ONLY FROM THE NECK DOWN.   Do not use on face/ open                           Wound or open sores. Avoid contact with eyes, ears mouth and genitals (private parts).                       Wash face,  Genitals (private parts) with your normal soap.             6.  Wash thoroughly, paying special attention to the area where your surgery  will be performed.  7.  Thoroughly rinse your body with warm water from the neck down.  8.  DO NOT shower/wash with your normal soap after using and rinsing off  the CHG Soap.                9.  Pat yourself dry with a clean towel.            10.  Wear clean pajamas.            11.  Place clean sheets on your bed the night of your first shower and do not  sleep with pets. Day of Surgery  : Do not apply any lotions/deodorants the morning of surgery.  Please wear clean clothes to the hospital/surgery center.  FAILURE TO FOLLOW THESE INSTRUCTIONS MAY RESULT IN THE CANCELLATION OF YOUR SURGERY PATIENT SIGNATURE_________________________________  NURSE SIGNATURE__________________________________  ________________________________________________________________________   Adam Phenix  An incentive spirometer is a tool that can help keep your lungs clear and active. This tool measures how well you are filling your lungs with each breath. Taking long deep breaths may help reverse or decrease the chance of developing breathing (pulmonary) problems (especially infection) following: A long period of time when you are unable to move or be active. BEFORE THE PROCEDURE  If the spirometer includes an indicator to show your best effort, your nurse or respiratory therapist will set it to a desired goal. If possible, sit up straight or lean slightly forward. Try not to slouch. Hold the incentive spirometer in an upright position. INSTRUCTIONS FOR USE  Sit on the edge of your bed if possible, or sit up as far as you can in bed or on a chair. Hold the incentive spirometer in an upright position. Breathe out normally. Place the mouthpiece in your mouth and seal your lips tightly around it. Breathe in slowly and as deeply as possible, raising the piston or the ball toward the top of the column. Hold your breath for 3-5 seconds or for as long as possible. Allow the piston or ball to fall to the bottom of the column. Remove the mouthpiece from your mouth and breathe out normally. Rest for a few seconds and repeat Steps 1 through 7 at least 10 times every 1-2 hours when you are awake. Take your time and take a few normal breaths between deep breaths. The spirometer may include an indicator to show your best effort. Use the indicator as a goal to work toward during each repetition. After  each set of 10 deep breaths, practice coughing to be sure your lungs are clear. If you have an incision (the cut made at the time of surgery), support your incision when coughing by placing a pillow or rolled up towels  firmly against it. Once you are able to get out of bed, walk around indoors and cough well. You may stop using the incentive spirometer when instructed by your caregiver.  RISKS AND COMPLICATIONS Take your time so you do not get dizzy or light-headed. If you are in pain, you may need to take or ask for pain medication before doing incentive spirometry. It is harder to take a deep breath if you are having pain. AFTER USE Rest and breathe slowly and easily. It can be helpful to keep track of a log of your progress. Your caregiver can provide you with a simple table to help with this. If you are using the spirometer at home, follow these instructions: [place] IF:  You are having difficultly using the spirometer. You have trouble using the spirometer as often as instructed. Your pain medication is not giving enough relief while using the spirometer. You develop fever of 100.5 F (38.1 C) or higher. SEEK IMMEDIATE MEDICAL CARE IF:  You cough up bloody sputum that had not been present before. You develop fever of 102 F (38.9 C) or greater. You develop worsening pain at or near the incision site. MAKE SURE YOU:  Understand these instructions. Will watch your condition. Will get help right away if you are not doing well or get worse. Document Released: 07/11/2006 Document Revised: 05/23/2011 Document Reviewed: 09/11/2006 [facility] Patient Information 2014 [place], [place].   ________________________________________________________________________

## 2021-12-01 ENCOUNTER — Encounter (HOSPITAL_COMMUNITY): Payer: Self-pay

## 2021-12-01 ENCOUNTER — Encounter (HOSPITAL_COMMUNITY)
Admission: RE | Admit: 2021-12-01 | Discharge: 2021-12-01 | Disposition: A | Discharge status: Home / Self Care | Payer: BC Managed Care – PPO | Source: Ambulatory Visit | Attending: Orthopedic Surgery | Admitting: Orthopedic Surgery

## 2021-12-01 ENCOUNTER — Other Ambulatory Visit: Payer: Self-pay

## 2021-12-01 DIAGNOSIS — Z01818 Encounter for other preprocedural examination: Secondary | ICD-10-CM

## 2021-12-01 HISTORY — DX: Anemia, unspecified: D64.9

## 2021-12-01 NOTE — Progress Notes (Addendum)
Pt. Did not showed up for PST appointment,interview was done over the phone,labs are reschedule for 12/02/21 @ 3:00 PM.

## 2021-12-01 NOTE — Progress Notes (Signed)
For Short Stay: [place] appointment date: Date of COVID positive in last 59 days:  Bowel Prep reminder:   For Anesthesia: PCP - NO PCP Cardiologist -   Chest x-ray -  EKG -  Stress Test -  ECHO -  Cardiac Cath -  Pacemaker/ICD device last checked: Pacemaker orders received: Device Rep notified:  Spinal Cord Stimulator:  Sleep Study -  CPAP -   Fasting Blood Sugar -  Checks Blood Sugar _____ times a day Date and result of last Hgb A1c-  Blood Thinner Instructions: Aspirin Instructions: Last Dose:  Activity level: Can go up a flight of stairs and activities of daily living without stopping and without chest pain and/or shortness of breath   Able to exercise without chest pain and/or shortness of breath   Unable to go up a flight of stairs without chest pain and/or shortness of breath     Anesthesia review:   Patient denies shortness of breath, fever, cough and chest pain at PAT appointment   Patient verbalized understanding of instructions that were given to them at the PAT appointment. Patient was also instructed that they will need to review over the PAT instructions again at home before surgery.

## 2021-12-02 ENCOUNTER — Encounter (HOSPITAL_COMMUNITY)
Admission: RE | Admit: 2021-12-02 | Discharge: 2021-12-02 | Disposition: A | Discharge status: Home / Self Care | Payer: No Typology Code available for payment source | Source: Ambulatory Visit | Attending: Orthopedic Surgery | Admitting: Orthopedic Surgery

## 2021-12-02 VITALS — BP 110/79 | HR 81 | Temp 98.3°F | Resp 18 | Ht 62.0 in | Wt 145.0 lb

## 2021-12-02 DIAGNOSIS — Z01812 Encounter for preprocedural laboratory examination: Secondary | ICD-10-CM | POA: Insufficient documentation

## 2021-12-02 DIAGNOSIS — Z01818 Encounter for other preprocedural examination: Secondary | ICD-10-CM

## 2021-12-02 LAB — CBC
HCT: 41.3 % (ref 36.0–46.0)
Hemoglobin: 13.6 g/dL (ref 12.0–15.0)
MCH: 29.8 pg (ref 26.0–34.0)
MCHC: 32.9 g/dL (ref 30.0–36.0)
MCV: 90.4 fL (ref 80.0–100.0)
Platelets: 302 10*3/uL (ref 150–400)
RBC: 4.57 MIL/uL (ref 3.87–5.11)
RDW: 12.5 % (ref 11.5–15.5)
WBC: 5.3 10*3/uL (ref 4.0–10.5)
nRBC: 0 % (ref 0.0–0.2)

## 2021-12-06 ENCOUNTER — Encounter (HOSPITAL_COMMUNITY): Payer: Self-pay | Admitting: Orthopedic Surgery

## 2021-12-07 ENCOUNTER — Other Ambulatory Visit: Payer: Self-pay

## 2021-12-07 ENCOUNTER — Encounter (HOSPITAL_COMMUNITY)
Admission: RE | Disposition: A | Discharge status: Home / Self Care | Payer: Self-pay | Source: Home / Self Care | Attending: Orthopedic Surgery

## 2021-12-07 ENCOUNTER — Ambulatory Visit (HOSPITAL_COMMUNITY)
Admission: RE | Admit: 2021-12-07 | Discharge: 2021-12-07 | Disposition: A | Discharge status: Home / Self Care | Payer: No Typology Code available for payment source | Attending: Orthopedic Surgery | Admitting: Orthopedic Surgery

## 2021-12-07 ENCOUNTER — Ambulatory Visit (HOSPITAL_COMMUNITY): Payer: BC Managed Care – PPO

## 2021-12-07 ENCOUNTER — Ambulatory Visit (HOSPITAL_COMMUNITY): Payer: No Typology Code available for payment source | Admitting: Anesthesiology

## 2021-12-07 ENCOUNTER — Ambulatory Visit (HOSPITAL_BASED_OUTPATIENT_CLINIC_OR_DEPARTMENT_OTHER): Payer: No Typology Code available for payment source | Admitting: Anesthesiology

## 2021-12-07 ENCOUNTER — Encounter (HOSPITAL_COMMUNITY): Payer: Self-pay | Admitting: Orthopedic Surgery

## 2021-12-07 DIAGNOSIS — W19XXXA Unspecified fall, initial encounter: Secondary | ICD-10-CM | POA: Insufficient documentation

## 2021-12-07 DIAGNOSIS — F411 Generalized anxiety disorder: Secondary | ICD-10-CM | POA: Insufficient documentation

## 2021-12-07 DIAGNOSIS — G43909 Migraine, unspecified, not intractable, without status migrainosus: Secondary | ICD-10-CM | POA: Insufficient documentation

## 2021-12-07 DIAGNOSIS — S82831A Other fracture of upper and lower end of right fibula, initial encounter for closed fracture: Secondary | ICD-10-CM | POA: Diagnosis present

## 2021-12-07 DIAGNOSIS — Z01818 Encounter for other preprocedural examination: Secondary | ICD-10-CM

## 2021-12-07 HISTORY — PX: ORIF FIBULA FRACTURE: SHX5114

## 2021-12-07 LAB — POCT PREGNANCY, URINE: Preg Test, Ur: NEGATIVE

## 2021-12-07 SURGERY — OPEN REDUCTION INTERNAL FIXATION (ORIF) FIBULA FRACTURE
Anesthesia: General | Laterality: Right

## 2021-12-07 MED ORDER — FENTANYL CITRATE (PF) 100 MCG/2ML IJ SOLN
INTRAMUSCULAR | Status: AC
  Filled 2021-12-07: qty 2

## 2021-12-07 MED ORDER — ONDANSETRON 4 MG PO TBDP: 4.0000 mg | ORAL_TABLET | Freq: Three times a day (TID) | ORAL | 0 refills | Status: DC | PRN

## 2021-12-07 MED ORDER — POVIDONE-IODINE 7.5 % EX SOLN: Freq: Once | CUTANEOUS | Status: DC

## 2021-12-07 MED ORDER — LIDOCAINE HCL (CARDIAC) PF 100 MG/5ML IV SOSY
PREFILLED_SYRINGE | INTRAVENOUS | Status: DC | PRN
  Administered 2021-12-07: 60 mg via INTRAVENOUS

## 2021-12-07 MED ORDER — MIDAZOLAM HCL 2 MG/2ML IJ SOLN
2.0000 mg | INTRAMUSCULAR | Status: DC
  Administered 2021-12-07: 2 mg via INTRAVENOUS
  Filled 2021-12-07: qty 2

## 2021-12-07 MED ORDER — ACETAMINOPHEN 500 MG PO TABS: 1000.0000 mg | ORAL_TABLET | Freq: Once | ORAL | Status: DC

## 2021-12-07 MED ORDER — ORAL CARE MOUTH RINSE: 15.0000 mL | Freq: Once | OROMUCOSAL | Status: AC

## 2021-12-07 MED ORDER — DEXAMETHASONE SODIUM PHOSPHATE 4 MG/ML IJ SOLN
INTRAMUSCULAR | Status: DC | PRN
  Administered 2021-12-07: 8 mg via INTRAVENOUS

## 2021-12-07 MED ORDER — FENTANYL CITRATE (PF) 100 MCG/2ML IJ SOLN
INTRAMUSCULAR | Status: DC | PRN
  Administered 2021-12-07: 25 ug via INTRAVENOUS
  Administered 2021-12-07: 50 ug via INTRAVENOUS
  Administered 2021-12-07: 25 ug via INTRAVENOUS

## 2021-12-07 MED ORDER — ACETAMINOPHEN 500 MG PO TABS
1000.0000 mg | ORAL_TABLET | Freq: Once | ORAL | Status: AC
  Administered 2021-12-07: 1000 mg via ORAL
  Filled 2021-12-07: qty 2

## 2021-12-07 MED ORDER — FENTANYL CITRATE PF 50 MCG/ML IJ SOSY
100.0000 ug | PREFILLED_SYRINGE | INTRAMUSCULAR | Status: DC
  Administered 2021-12-07: 100 ug via INTRAVENOUS
  Filled 2021-12-07: qty 2

## 2021-12-07 MED ORDER — HYDROMORPHONE HCL 1 MG/ML IJ SOLN: 0.2500 mg | INTRAMUSCULAR | Status: DC | PRN

## 2021-12-07 MED ORDER — POVIDONE-IODINE 10 % EX SWAB
2.0000 | Freq: Once | CUTANEOUS | Status: AC
  Administered 2021-12-07: 2 via TOPICAL

## 2021-12-07 MED ORDER — EPHEDRINE SULFATE (PRESSORS) 50 MG/ML IJ SOLN
INTRAMUSCULAR | Status: DC | PRN
  Administered 2021-12-07 (×3): 5 mg via INTRAVENOUS

## 2021-12-07 MED ORDER — DEXMEDETOMIDINE HCL IN NACL 80 MCG/20ML IV SOLN
INTRAVENOUS | Status: DC | PRN
  Administered 2021-12-07 (×2): 4 ug via BUCCAL

## 2021-12-07 MED ORDER — DEXMEDETOMIDINE HCL IN NACL 80 MCG/20ML IV SOLN
INTRAVENOUS | Status: AC
  Filled 2021-12-07: qty 20

## 2021-12-07 MED ORDER — LACTATED RINGERS IV SOLN: INTRAVENOUS | Status: DC

## 2021-12-07 MED ORDER — KETOROLAC TROMETHAMINE 30 MG/ML IJ SOLN
INTRAMUSCULAR | Status: DC | PRN
  Administered 2021-12-07: 30 mg via INTRAVENOUS

## 2021-12-07 MED ORDER — CEFAZOLIN SODIUM-DEXTROSE 2-4 GM/100ML-% IV SOLN
2.0000 g | INTRAVENOUS | Status: AC
  Administered 2021-12-07: 2 g via INTRAVENOUS
  Filled 2021-12-07: qty 100

## 2021-12-07 MED ORDER — ONDANSETRON HCL 4 MG/2ML IJ SOLN
INTRAMUSCULAR | Status: DC | PRN
  Administered 2021-12-07: 4 mg via INTRAVENOUS

## 2021-12-07 MED ORDER — BUPIVACAINE-EPINEPHRINE (PF) 0.5% -1:200000 IJ SOLN
INTRAMUSCULAR | Status: DC | PRN
  Administered 2021-12-07: 30 mL via PERINEURAL

## 2021-12-07 MED ORDER — TRAMADOL HCL 50 MG PO TABS: 50.0000 mg | ORAL_TABLET | ORAL | 0 refills | Status: AC | PRN

## 2021-12-07 MED ORDER — 0.9 % SODIUM CHLORIDE (POUR BTL) OPTIME
TOPICAL | Status: DC | PRN
  Administered 2021-12-07: 1000 mL

## 2021-12-07 MED ORDER — EPHEDRINE 5 MG/ML INJ
INTRAVENOUS | Status: AC
  Filled 2021-12-07: qty 15

## 2021-12-07 MED ORDER — CHLORHEXIDINE GLUCONATE 0.12 % MT SOLN
15.0000 mL | Freq: Once | OROMUCOSAL | Status: AC
  Administered 2021-12-07: 15 mL via OROMUCOSAL

## 2021-12-07 MED ORDER — PROPOFOL 10 MG/ML IV BOLUS
INTRAVENOUS | Status: DC | PRN
  Administered 2021-12-07: 200 mg via INTRAVENOUS

## 2021-12-07 SURGICAL SUPPLY — 65 items
BAG COUNTER SPONGE SURGICOUNT (BAG) IMPLANT
BAG ZIPLOCK 12X15 (MISCELLANEOUS) ×1 IMPLANT
BANDAGE ESMARK 6X9 LF (GAUZE/BANDAGES/DRESSINGS) ×1 IMPLANT
BENZOIN TINCTURE PRP APPL 2/3 (GAUZE/BANDAGES/DRESSINGS) ×1 IMPLANT
BIT DRILL 110X2.5XQCK CNCT (BIT) IMPLANT
BIT DRILL 2.5 (BIT) ×1
BIT DRL 110X2.5XQCK CNCT (BIT) ×1
BNDG ELASTIC 4X5.8 VLCR STR LF (GAUZE/BANDAGES/DRESSINGS) IMPLANT
BNDG ELASTIC 6X5.8 VLCR STR LF (GAUZE/BANDAGES/DRESSINGS) ×2 IMPLANT
BNDG ESMARK 6X9 LF (GAUZE/BANDAGES/DRESSINGS) ×1
BNDG GAUZE DERMACEA FLUFF 4 (GAUZE/BANDAGES/DRESSINGS) ×1 IMPLANT
COVER SURGICAL LIGHT HANDLE (MISCELLANEOUS) ×1 IMPLANT
CUFF TOURN SGL QUICK 34 (TOURNIQUET CUFF) ×1
CUFF TRNQT CYL 34X4.125X (TOURNIQUET CUFF) ×1 IMPLANT
DRAPE C-ARM 42X120 X-RAY (DRAPES) ×1 IMPLANT
DRAPE ORTHO SPLIT 77X108 STRL (DRAPES) ×1
DRAPE SURG ORHT 6 SPLT 77X108 (DRAPES) ×1 IMPLANT
DRAPE U-SHAPE 47X51 STRL (DRAPES) ×1 IMPLANT
DRSG ADAPTIC 3X8 NADH LF (GAUZE/BANDAGES/DRESSINGS) ×1 IMPLANT
DURAPREP 26ML APPLICATOR (WOUND CARE) ×1 IMPLANT
ELECT REM PT RETURN 15FT ADLT (MISCELLANEOUS) ×1 IMPLANT
GAUZE PAD ABD 8X10 STRL (GAUZE/BANDAGES/DRESSINGS) ×1 IMPLANT
GAUZE SPONGE 4X4 12PLY STRL (GAUZE/BANDAGES/DRESSINGS) ×1 IMPLANT
GLOVE BIOGEL PI IND STRL 8 (GLOVE) ×1 IMPLANT
GLOVE ECLIPSE 7.5 STRL STRAW (GLOVE) ×1 IMPLANT
GOWN STRL REUS W/ TWL LRG LVL3 (GOWN DISPOSABLE) ×1 IMPLANT
GOWN STRL REUS W/TWL LRG LVL3 (GOWN DISPOSABLE) ×1
IMMOBILIZER KNEE 20 (SOFTGOODS) ×1
IMMOBILIZER KNEE 20 THIGH 36 (SOFTGOODS) ×1 IMPLANT
KIT BASIN OR (CUSTOM PROCEDURE TRAY) ×1 IMPLANT
KIT TURNOVER KIT A (KITS) IMPLANT
NS IRRIG 1000ML POUR BTL (IV SOLUTION) ×1 IMPLANT
PACK ORTHO EXTREMITY (CUSTOM PROCEDURE TRAY) ×1 IMPLANT
PAD CAST 4YDX4 CTTN HI CHSV (CAST SUPPLIES) IMPLANT
PADDING CAST COTTON 4X4 STRL (CAST SUPPLIES) ×3
PENCIL SMOKE EVACUATOR (MISCELLANEOUS) IMPLANT
PLATE 5HOLE 1/3 TUBULAR (Plate) IMPLANT
PROTECTOR NERVE ULNAR (MISCELLANEOUS) ×1 IMPLANT
SCREW CANC 2.5XFT 16X4XST SM (Screw) IMPLANT
SCREW CANC 4.0X16 (Screw) ×2 IMPLANT
SCREW CORT S/T 3.5X22 (Screw) IMPLANT
SCREW CORTICAL 3.5X12 (Screw) IMPLANT
SOL PREP POV-IOD 4OZ 10% (MISCELLANEOUS) IMPLANT
SOL SCRUB PVP POV-IOD 4OZ 7.5% (MISCELLANEOUS)
SOLUTION SCRB POV-IOD 4OZ 7.5% (MISCELLANEOUS) IMPLANT
SPLINT PLASTER CAST XFAST 5X30 (CAST SUPPLIES) IMPLANT
SPONGE T-LAP 4X18 ~~LOC~~+RFID (SPONGE) ×2 IMPLANT
STAPLER VISISTAT 35W (STAPLE) ×1 IMPLANT
STOCKINETTE 8 INCH (MISCELLANEOUS) ×1 IMPLANT
STRIP CLOSURE SKIN 1/2X4 (GAUZE/BANDAGES/DRESSINGS) ×1 IMPLANT
SUCTION FRAZIER HANDLE 10FR (MISCELLANEOUS) ×1
SUCTION TUBE FRAZIER 10FR DISP (MISCELLANEOUS) ×1 IMPLANT
SUT FIBERWIRE #2 38 T-5 BLUE (SUTURE)
SUT FIBERWIRE #5 38 BLUE (WIRE) IMPLANT
SUT MNCRL AB 4-0 PS2 18 (SUTURE) ×1 IMPLANT
SUT VIC AB 0 CT1 36 (SUTURE) ×1 IMPLANT
SUT VIC AB 2-0 CT1 27 (SUTURE) ×2
SUT VIC AB 2-0 CT1 TAPERPNT 27 (SUTURE) ×2 IMPLANT
SUT VIC AB 3-0 SH 18 (SUTURE) ×1 IMPLANT
SUT VIC AB 3-0 SH 27 (SUTURE) ×1
SUT VIC AB 3-0 SH 27XBRD (SUTURE) IMPLANT
SUTURE FIBERWR #2 38 T-5 BLUE (SUTURE) IMPLANT
TOWEL OR 17X26 10 PK STRL BLUE (TOWEL DISPOSABLE) ×2 IMPLANT
UNDERPAD 30X36 HEAVY ABSORB (UNDERPADS AND DIAPERS) ×1 IMPLANT
WATER STERILE IRR 1000ML POUR (IV SOLUTION) ×1 IMPLANT

## 2021-12-07 NOTE — Interval H&P Note (Signed)
History and Physical Interval Note:  12/07/2021 1:31 PM  Cassie English  has presented today for surgery, with the diagnosis of right distal fibula fracture.  The various methods of treatment have been discussed with the patient and family. After consideration of risks, benefits and other options for treatment, the patient has consented to  Procedure(s): OPEN REDUCTION INTERNAL FIXATION (ORIF) FIBULA FRACTURE (Right) as a surgical intervention.  The patient's history has been reviewed, patient examined, no change in status, stable for surgery.  I have reviewed the patient's chart and labs.  Questions were answered to the patient's satisfaction.     [name]

## 2021-12-07 NOTE — Op Note (Signed)
12/07/2021  PATIENT:  Cassie English    PRE-OPERATIVE DIAGNOSIS: Right distal fibula fracture  POST-OPERATIVE DIAGNOSIS:  Same  PROCEDURE:    1.  Open Reduction Internal Fixation Fibula 2.  3 views plus a stress view of the right ankle  SURGEON:  [name], MD  PHYSICIAN ASSISTANT: Merlene Pulling, PA-C, present and scrubbed throughout the case, critical for completion in a timely fashion, and for retraction, instrumentation, and closure.  ANESTHESIA:   General  ESTIMATED BLOOD LOSS: Minimal  UNIQUE ASPECTS OF THE CASE: The fracture was shortened and displaced, but reduced anatomically.  During the surgical approach, I was directly lateral to the fibula, but encountered what could have been a superficial skin nerve, there was some fascicles associated with it, and it was lying immediately adjacent posterior to the fibular edge.  This was a very unusual position, because I was fairly distal, it was basically of right overlying the fracture site, and I would not expected to encounter a nerve in that location.  The fascicles were resected in order to try and minimize neuroma formation.  We will have to see on her postoperative exam if there is any deficit.  PREOPERATIVE INDICATIONS:  Cassie English is a  41 y.o. female with a diagnosis of right distal fibula fracture who elected for surgical management to minimize the risk for malunion and nonunion and post-traumatic arthritis.    The risks benefits and alternatives were discussed with the patient preoperatively including but not limited to the risks of infection, bleeding, nerve injury, cardiopulmonary complications, the need for revision surgery, the need for hardware removal, among others, and the patient was willing to proceed.  OPERATIVE IMPLANTS: Biomet / Zimmer 1/3 tubular plate, with a single interfragmentary lag screw.  I used 2 distal cancellous screws and 2 proximal cortical screws with excellent fixation.  OPERATIVE  PROCEDURE: The patient was brought to the operating room and placed in the supine position. All bony prominences were padded. General anesthesia was administered. The lower extremity was prepped and draped in the usual sterile fashion.  Tourniquet was not utilized.  Time out was performed.   Incision was made over the distal fibula and the fracture was exposed and reduced anatomically with a clamp. A interfragmentary screw was placed. I then applied a 1/3 tubular plate and secured it proximally and distally non-locking screws. Bone quality was fair. I used c-arm to confirm satisfactory reduction and fixation.   The syndesmosis was stressed using live fluoroscopy and found to be stable.   The wounds were irrigated, and closed with vicryl with routine closure for the skin. The wounds were injected with local anesthetic. Sterile gauze was applied followed by a posterior splint. She was awakened and returned to the PACU in stable and satisfactory condition. There were no complications.  3-Views plus a stress view of the ankle demonstrated appropriate reconstruction of the mortise with internal fixation and appropriate stability of the syndesmosis.

## 2021-12-07 NOTE — Anesthesia Preprocedure Evaluation (Addendum)
Anesthesia Evaluation  Patient identified by MRN, date of birth, ID band Patient awake    Reviewed: Allergy & Precautions, H&P , NPO status , Patient's Chart, lab work & pertinent test results  Airway Mallampati: II  TM Distance: >3 FB Neck ROM: Full    Dental no notable dental hx. (+) Teeth Intact, Dental Advisory Given   Pulmonary neg pulmonary ROS,    Pulmonary exam normal breath sounds clear to auscultation       Cardiovascular negative cardio ROS   Rhythm:Regular Rate:Normal     Neuro/Psych  Headaches, Anxiety    GI/Hepatic Neg liver ROS, GERD  ,  Endo/Other  negative endocrine ROS  Renal/GU negative Renal ROS  negative genitourinary   Musculoskeletal   Abdominal   Peds  Hematology  (+) Blood dyscrasia, anemia ,   Anesthesia Other Findings   Reproductive/Obstetrics negative OB ROS                            Anesthesia Physical Anesthesia Plan  ASA: 2  Anesthesia Plan: General   Post-op Pain Management: Regional block*, Tylenol PO (pre-op)* and Toradol IV (intra-op)*   Induction: Intravenous  PONV Risk Score and Plan: 4 or greater and Ondansetron, Dexamethasone and Midazolam  Airway Management Planned: LMA  Additional Equipment:   Intra-op Plan:   Post-operative Plan: Extubation in OR  Informed Consent: I have reviewed the patients History and Physical, chart, labs and discussed the procedure including the risks, benefits and alternatives for the proposed anesthesia with the patient or authorized representative who has indicated his/her understanding and acceptance.     Dental advisory given  Plan Discussed with: CRNA  Anesthesia Plan Comments:         Anesthesia Quick Evaluation

## 2021-12-07 NOTE — Anesthesia Procedure Notes (Signed)
Anesthesia Regional Block: Popliteal block   Pre-Anesthetic Checklist: , timeout performed,  Correct Patient, Correct Site, Correct Laterality,  Correct Procedure, Correct Position, site marked,  Risks and benefits discussed,  Pre-op evaluation,  At surgeon's request and post-op pain management  Laterality: Right  Prep: Maximum Sterile Barrier Precautions used, chloraprep       Needles:  Injection technique: Single-shot  Needle Type: Echogenic Stimulator Needle     Needle Length: 9cm  Needle Gauge: 21     Additional Needles:   Procedures:,,,, ultrasound used (permanent image in chart),,    Narrative:  Start time: 12/07/2021 1:10 PM End time: 12/07/2021 1:20 PM Injection made incrementally with aspirations every 5 mL.  Performed by: Personally  Anesthesiologist: Roderic Palau, MD

## 2021-12-07 NOTE — Transfer of Care (Signed)
Immediate Anesthesia Transfer of Care Note  Patient: Cassie English  Procedure(s) Performed: OPEN REDUCTION INTERNAL FIXATION (ORIF) FIBULA FRACTURE (Right)  Patient Location: PACU  Anesthesia Type:GA combined with regional for post-op pain  Level of Consciousness: awake  Airway & Oxygen Therapy: Patient Spontanous Breathing and Patient connected to face mask oxygen  Post-op Assessment: Report given to RN and Post -op Vital signs reviewed and stable  Post vital signs: Reviewed and stable  Last Vitals:  Vitals Value Taken Time  BP    Temp    Pulse    Resp    SpO2      Last Pain:  Vitals:   12/07/21 1325  TempSrc:   PainSc: Asleep      Patients Stated Pain Goal: 8 (25/05/39 7673)  Complications: No notable events documented.

## 2021-12-07 NOTE — Discharge Instructions (Signed)
Diet: As you were doing prior to hospitalization   Shower:  May shower but keep the wounds dry, use an occlusive plastic wrap, NO SOAKING IN TUB.  If the bandage gets wet, change with a clean dry gauze.  If you have a splint on, leave the splint in place and keep the splint dry with a plastic bag.  Dressing:  You may change your dressing 3-5 days after surgery, unless you have a splint.  If you have a splint, then just leave the splint in place and we will change your bandages during your first follow-up appointment.    If you had hand or foot surgery, we will plan to remove your stitches in about 2 weeks in the office.  For all other surgeries, there are sticky tapes (steri-strips) on your wounds and all the stitches are absorbable.  Leave the steri-strips in place when changing your dressings, they will peel off with time, usually 2-3 weeks.  Activity:  Increase activity slowly as tolerated, but follow the weight bearing instructions below.  The rules on driving is that you can not be taking narcotics while you drive, and you must feel in control of the vehicle.    Weight Bearing:   No bearing weight with right leg  To prevent constipation: you may use a stool softener such as -  Colace (over the counter) 100 mg by mouth twice a day  Drink plenty of fluids (prune juice may be helpful) and high fiber foods Miralax (over the counter) for constipation as needed.    Itching:  If you experience itching with your medications, try taking only a single pain pill, or even half a pain pill at a time.  You may take up to 10 pain pills per day, and you can also use benadryl over the counter for itching or also to help with sleep.   Precautions:  If you experience chest pain or shortness of breath - call 911 immediately for transfer to the hospital emergency department!!  If you develop a fever greater that 101 F, purulent drainage from wound, increased redness or drainage from wound, or calf pain -- Call  the office at [phone]                                                Follow- Up Appointment:  Please call for an appointment to be seen in 2 weeks [place] - [phone]

## 2021-12-07 NOTE — Anesthesia Procedure Notes (Signed)
Procedure Name: LMA Insertion Date/Time: 12/07/2021 2:52 PM  Performed by: Lavina Hamman, CRNAPre-anesthesia Checklist: Patient identified, Emergency Drugs available, Suction available, Patient being monitored and Timeout performed Patient Re-evaluated:Patient Re-evaluated prior to induction Oxygen Delivery Method: Circle system utilized Preoxygenation: Pre-oxygenation with 100% oxygen Induction Type: IV induction Ventilation: Mask ventilation without difficulty LMA: LMA inserted LMA Size: 4.0 Tube size: 4.0 mm Number of attempts: 1 Placement Confirmation: positive ETCO2 and breath sounds checked- equal and bilateral Tube secured with: Tape Dental Injury: Teeth and Oropharynx as per pre-operative assessment

## 2021-12-08 ENCOUNTER — Encounter (HOSPITAL_COMMUNITY): Payer: Self-pay | Admitting: Orthopedic Surgery

## 2021-12-08 NOTE — Anesthesia Postprocedure Evaluation (Signed)
Anesthesia Post Note  Patient: Durel Salts  Procedure(s) Performed: OPEN REDUCTION INTERNAL FIXATION (ORIF) FIBULA FRACTURE (Right)     Patient location during evaluation: PACU Anesthesia Type: General and Regional Level of consciousness: awake and alert Pain management: pain level controlled Vital Signs Assessment: post-procedure vital signs reviewed and stable Respiratory status: spontaneous breathing, nonlabored ventilation and respiratory function stable Cardiovascular status: blood pressure returned to baseline and stable Postop Assessment: no apparent nausea or vomiting Anesthetic complications: no   No notable events documented.  Last Vitals:  Vitals:   12/07/21 1645 12/07/21 1730  BP: 118/79 120/73  Pulse: 91 92  Resp: 18 18  Temp:    SpO2: 99% 99%    Last Pain:  Vitals:   12/07/21 1730  TempSrc:   PainSc: 0-No pain                 Alexandra Lipps,W. EDMOND

## 2023-01-03 ENCOUNTER — Encounter: Payer: Self-pay | Admitting: Family

## 2023-01-03 ENCOUNTER — Other Ambulatory Visit: Payer: Self-pay | Admitting: Family

## 2023-01-03 ENCOUNTER — Ambulatory Visit: Payer: No Typology Code available for payment source | Admitting: Family

## 2023-01-03 VITALS — BP 116/84 | HR 84 | Temp 98.0°F | Ht 62.0 in | Wt 175.0 lb

## 2023-01-03 DIAGNOSIS — E7800 Pure hypercholesterolemia, unspecified: Secondary | ICD-10-CM

## 2023-01-03 DIAGNOSIS — E559 Vitamin D deficiency, unspecified: Secondary | ICD-10-CM

## 2023-01-03 DIAGNOSIS — F3281 Premenstrual dysphoric disorder: Secondary | ICD-10-CM

## 2023-01-03 DIAGNOSIS — F419 Anxiety disorder, unspecified: Secondary | ICD-10-CM | POA: Diagnosis not present

## 2023-01-03 DIAGNOSIS — Z862 Personal history of diseases of the blood and blood-forming organs and certain disorders involving the immune mechanism: Secondary | ICD-10-CM | POA: Diagnosis not present

## 2023-01-03 DIAGNOSIS — R41840 Attention and concentration deficit: Secondary | ICD-10-CM

## 2023-01-03 DIAGNOSIS — F418 Other specified anxiety disorders: Secondary | ICD-10-CM

## 2023-01-03 DIAGNOSIS — G43709 Chronic migraine without aura, not intractable, without status migrainosus: Secondary | ICD-10-CM | POA: Diagnosis not present

## 2023-01-03 DIAGNOSIS — E282 Polycystic ovarian syndrome: Secondary | ICD-10-CM

## 2023-01-03 DIAGNOSIS — G43101 Migraine with aura, not intractable, with status migrainosus: Secondary | ICD-10-CM | POA: Diagnosis not present

## 2023-01-03 DIAGNOSIS — G8929 Other chronic pain: Secondary | ICD-10-CM

## 2023-01-03 DIAGNOSIS — M25571 Pain in right ankle and joints of right foot: Secondary | ICD-10-CM

## 2023-01-03 DIAGNOSIS — R87610 Atypical squamous cells of undetermined significance on cytologic smear of cervix (ASC-US): Secondary | ICD-10-CM

## 2023-01-03 DIAGNOSIS — Z79899 Other long term (current) drug therapy: Secondary | ICD-10-CM

## 2023-01-03 DIAGNOSIS — R110 Nausea: Secondary | ICD-10-CM

## 2023-01-03 LAB — IBC + FERRITIN
Ferritin: 30.3 ng/mL (ref 10.0–291.0)
Iron: 59 ug/dL (ref 42–145)
Saturation Ratios: 12.2 % — ABNORMAL LOW (ref 20.0–50.0)
TIBC: 483 ug/dL — ABNORMAL HIGH (ref 250.0–450.0)
Transferrin: 345 mg/dL (ref 212.0–360.0)

## 2023-01-03 LAB — BASIC METABOLIC PANEL
BUN: 12 mg/dL (ref 6–23)
CO2: 29 meq/L (ref 19–32)
Calcium: 8.9 mg/dL (ref 8.4–10.5)
Chloride: 104 meq/L (ref 96–112)
Creatinine, Ser: 0.59 mg/dL (ref 0.40–1.20)
GFR: 109.72 mL/min (ref >60.00)
Glucose, Bld: 85 mg/dL (ref 70–99)
Potassium: 4.4 meq/L (ref 3.5–5.1)
Sodium: 141 meq/L (ref 135–145)

## 2023-01-03 LAB — CBC
HCT: 41.4 % (ref 36.0–46.0)
Hemoglobin: 13.1 g/dL (ref 12.0–15.0)
MCHC: 31.7 g/dL (ref 30.0–36.0)
MCV: 86.3 fL (ref 78.0–100.0)
Platelets: 264 10*3/uL (ref 150.0–400.0)
RBC: 4.79 Mil/uL (ref 3.87–5.11)
RDW: 13.8 % (ref 11.5–15.5)
WBC: 5.2 10*3/uL (ref 4.0–10.5)

## 2023-01-03 LAB — LIPID PANEL
Cholesterol: 245 mg/dL — ABNORMAL HIGH (ref 0–200)
HDL: 67 mg/dL (ref >39.00)
LDL Cholesterol: 155 mg/dL — ABNORMAL HIGH (ref 0–99)
NonHDL: 178.02
Total CHOL/HDL Ratio: 4
Triglycerides: 114 mg/dL (ref 0.0–149.0)
VLDL: 22.8 mg/dL (ref 0.0–40.0)

## 2023-01-03 LAB — VITAMIN D 25 HYDROXY (VIT D DEFICIENCY, FRACTURES): VITD: 20.11 ng/mL — ABNORMAL LOW (ref 30.00–100.00)

## 2023-01-03 MED ORDER — FLUOXETINE HCL 20 MG PO CAPS: ORAL_CAPSULE | ORAL | 3 refills | Status: DC

## 2023-01-03 MED ORDER — AMPHETAMINE-DEXTROAMPHET ER 10 MG PO CP24: 10.0000 mg | ORAL_CAPSULE | Freq: Every day | ORAL | 0 refills | Status: DC

## 2023-01-03 MED ORDER — TOPIRAMATE 50 MG PO TABS: 50.0000 mg | ORAL_TABLET | Freq: Every day | ORAL | 3 refills | Status: DC

## 2023-01-03 MED ORDER — CHOLECALCIFEROL 1.25 MG (50000 UT) PO TABS: 1.0000 | ORAL_TABLET | ORAL | 0 refills | Status: DC

## 2023-01-03 MED ORDER — RIZATRIPTAN BENZOATE 10 MG PO TABS: 10.0000 mg | ORAL_TABLET | ORAL | 2 refills | Status: DC | PRN

## 2023-01-03 MED ORDER — ONDANSETRON 4 MG PO TBDP: 4.0000 mg | ORAL_TABLET | Freq: Three times a day (TID) | ORAL | 0 refills | Status: DC | PRN

## 2023-01-03 MED ORDER — NORETHINDRONE 0.35 MG PO TABS: ORAL_TABLET | ORAL | 3 refills | Status: DC

## 2023-01-03 NOTE — Patient Instructions (Signed)
  Half tablet 3-5 days topamax then can increase to 50 mg once daily.

## 2023-01-03 NOTE — Assessment & Plan Note (Signed)
Pt to f/u with orthopedist as scheduled.

## 2023-01-03 NOTE — Assessment & Plan Note (Signed)
Refill zofran, prn use with migraines

## 2023-01-03 NOTE — Assessment & Plan Note (Addendum)
Restart topamax 50 mg for preventative, start half tablet x one week then increase to one tablet once daily Reviewed with pt while taking this may make birth control else effective

## 2023-01-03 NOTE — Assessment & Plan Note (Signed)
Ordered vitamin d pending results.   

## 2023-01-03 NOTE — Assessment & Plan Note (Signed)
Trial start adderall XR 10 mg once daily Pdmp reviewed Drug contract signed UDS ordered and pending results.

## 2023-01-03 NOTE — Assessment & Plan Note (Signed)
Ordered lipid panel, pending results. Work on low cholesterol diet and exercise as tolerated  

## 2023-01-03 NOTE — Assessment & Plan Note (Signed)
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  Need for pap.  Pt to f/u in the next fews to get this completed.

## 2023-01-03 NOTE — Assessment & Plan Note (Signed)
Stable.  Continue with therapy as scheduled.  Continue prozac 20 mg once daily, refill sent.

## 2023-01-03 NOTE — Progress Notes (Signed)
New Patient Office Visit  Subjective:  Patient ID: Cassie English, female    DOB: 08/31/1980  Age: 42 y.o. MRN: [id number]  CC:  Chief Complaint  Patient presents with   Establish Care    HPI Cassie English is here to establish care as a new patient.  Oriented to practice routines and expectations.  Prior provider was: windy Mcneal  at [facility].   Pt is with acute concerns.  Pap high risk HPV 2020 with ASCUS  Diagnosed with ADD in the past, has tried vyvanse, ritalin, concerta and adderall and strattera. She did well on adderall, this one did well with lasting thorough the day. 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 Always on the go. Hard to focus and concentrate and stay on task at times.   chronic concerns:  Migraine: with aura, right eye started with pinhole vision. Typically starts at base of neck and goes up and over on the right side. Light sensitive. Nausea with vomiting at times. No diarrhea. Occasional brain zap feeling either on top of head and or on the side of her face. Seen teenage years. Topamax 150 mg however has been out of this for about a few years. Maxalt as needed however has been out of this as well. Frequency of migraines typically 1-2 times a month, will last 2-3 days.   Depression anxiety: doing well on prozac 20 mg once daily. Sees a therapist but not since about six months ago.     ROS: Negative unless specifically indicated above in HPI.   Current Outpatient Medications:    acetaminophen (TYLENOL) 500 MG tablet, Take 1,000 mg by mouth every 6 (six) hours as needed for moderate pain., Disp: , Rfl:    amphetamine-dextroamphetamine (ADDERALL XR) 10 MG 24 hr capsule, Take 1 capsule (10 mg total) by mouth daily., Disp: 30 capsule, Rfl: 0   FLUoxetine (PROZAC) 20 MG capsule, Take 1 capsule  daily, 2 capsules during menses, Disp: 105 capsule, Rfl: 3   norethindrone (ORTHO MICRONOR) 0.35 MG tablet, Take one od for continuous usage, do not use placebos just start new pack, Disp: 112 tablet, Rfl: 3   ondansetron (ZOFRAN-ODT) 4 MG disintegrating tablet, Take 1 tablet (4 mg total) by mouth every 8 (eight) hours as needed for nausea or vomiting., Disp: 10 tablet, Rfl: 0   rizatriptan (MAXALT) 10 MG tablet, Take 1 tablet (10 mg total) by mouth as needed for migraine. May repeat in 2 hours if needed, Disp: 10 tablet, Rfl: 2   topiramate (TOPAMAX) 50 MG tablet, Take 1 tablet (50 mg total) by mouth daily., Disp: 90 tablet, Rfl: 3 Past Medical History:  Diagnosis Date   ADD (attention deficit disorder)    Anemia    GAD (generalized anxiety disorder)    GERD (gastroesophageal reflux disease)    Migraine    Past Surgical History:  Procedure Laterality Date   CERVICAL CERCLAGE     ORIF FIBULA FRACTURE Right 12/07/2021   Procedure: OPEN REDUCTION INTERNAL FIXATION (ORIF) FIBULA FRACTURE;  Surgeon: Teryl Lucy, MD;  Location: [facility];  Service: Orthopedics;  Laterality: Right;   WISDOM TOOTH EXTRACTION      Objective:   Today's Vitals: BP 116/84 (BP Location: Left Arm, Patient Position: Sitting, Cuff Size: Normal)   Pulse 84   Temp 98 F (36.7 C) (Oral)  Ht 5\' 2"  (1.575 m)   Wt 175 lb (79.4 kg)   SpO2 98%   BMI 32.01 kg/m   Physical Exam Constitutional:      General: She is not in acute distress.    Appearance: Normal appearance. She is normal weight. She is not ill-appearing, toxic-appearing or diaphoretic.  HENT:     Head: Normocephalic.  Cardiovascular:     Rate and Rhythm: Normal rate and regular rhythm.  Pulmonary:     Effort: Pulmonary effort is normal.     Breath sounds: Normal breath sounds.  Musculoskeletal:        General: Normal range of motion.  Neurological:     General: No focal deficit present.     Mental Status: She is alert and oriented to person,  place, and time. Mental status is at baseline.  Psychiatric:        Mood and Affect: Mood normal.        Behavior: Behavior normal.        Thought Content: Thought content normal.        Judgment: Judgment normal.     Assessment & Plan:  PMDD (premenstrual dysphoric disorder) Assessment & Plan: Increase prozac to twice daily during menses Continuous ocp   Anxiety -     FLUoxetine HCl; Take 1 capsule daily, 2 capsules during menses  Dispense: 105 capsule; Refill: 3  PCOS (polycystic ovarian syndrome) -     Norethindrone; Take one od for continuous usage, do not use placebos just start new pack  Dispense: 112 tablet; Refill: 3  Chronic migraine without aura without status migrainosus, not intractable Assessment & Plan: Restart topamax 50 mg for preventative, start half tablet x one week then increase to one tablet once daily Reviewed with pt while taking this may make birth control else effective  Orders: -     Topiramate; Take 1 tablet (50 mg total) by mouth daily.  Dispense: 90 tablet; Refill: 3 -     Rizatriptan Benzoate; Take 1 tablet (10 mg total) by mouth as needed for migraine. May repeat in 2 hours if needed  Dispense: 10 tablet; Refill: 2 -     Ondansetron; Take 1 tablet (4 mg total) by mouth every 8 (eight) hours as needed for nausea or vomiting.  Dispense: 10 tablet; Refill: 0  Nausea Assessment & Plan: Refill zofran, prn use with migraines  Orders: -     Ondansetron; Take 1 tablet (4 mg total) by mouth every 8 (eight) hours as needed for nausea or vomiting.  Dispense: 10 tablet; Refill: 0 -     Basic metabolic panel  Migraine with aura and with status migrainosus, not intractable Assessment & Plan: Restart topamax 50 mg for preventative, start half tablet x one week then increase to one tablet once daily Reviewed with pt while taking this may make birth control else effective  Orders: -     Basic metabolic panel  Depression with anxiety Assessment &  Plan: Stable.  Continue with therapy as scheduled.  Continue prozac 20 mg once daily, refill sent.   Elevated LDL cholesterol level Assessment & Plan: Ordered lipid panel, pending results. Work on low cholesterol diet and exercise as tolerated     Orders: -     Lipid panel  History of anemia -     CBC -     IBC + Ferritin  Chronic pain of right ankle Assessment & Plan: Pt to f/u with orthopedist as scheduled.   High risk  medication use -     DRUG MONITORING, PANEL 8 WITH CONFIRMATION, URINE  Attention or concentration deficit Assessment & Plan: Trial start adderall XR 10 mg once daily Pdmp reviewed Drug contract signed UDS ordered and pending results.  Orders: -     Amphetamine-Dextroamphet ER; Take 1 capsule (10 mg total) by mouth daily.  Dispense: 30 capsule; Refill: 0  Vitamin D deficiency Assessment & Plan: Ordered vitamin d pending results.    Orders: -     VITAMIN D 25 Hydroxy (Vit-D Deficiency, Fractures)  ASCUS with positive high risk HPV cervical Assessment & Plan: 2020 high risk HPV reviewed in chart.  Need for pap.  Pt to f/u in the next fews to get this completed.     Follow-up: Return in about 3 weeks (around 01/24/2023) for f/u ADD medication and pap .   Mort Sawyers, FNP

## 2023-01-03 NOTE — Assessment & Plan Note (Signed)
Increase prozac to twice daily during menses Continuous ocp

## 2023-01-04 LAB — DRUG MONITORING, PANEL 8 WITH CONFIRMATION, URINE
6 Acetylmorphine: NEGATIVE ng/mL (ref <10)
Alcohol Metabolites: NEGATIVE ng/mL (ref <500)
Amphetamines: NEGATIVE ng/mL (ref <500)
Benzodiazepines: NEGATIVE ng/mL (ref <100)
Buprenorphine, Urine: NEGATIVE ng/mL (ref <5)
Cocaine Metabolite: NEGATIVE ng/mL (ref <150)
Creatinine: 80.1 mg/dL (ref >=20.0)
MDMA: NEGATIVE ng/mL (ref <500)
Marijuana Metabolite: NEGATIVE ng/mL (ref <20)
Opiates: NEGATIVE ng/mL (ref <100)
Oxidant: NEGATIVE ug/mL (ref <200)
Oxycodone: NEGATIVE ng/mL (ref <100)
pH: 7.9 (ref 4.5–9.0)

## 2023-01-04 LAB — DM TEMPLATE

## 2023-01-05 MED ORDER — AMPHETAMINE-DEXTROAMPHETAMINE 10 MG PO TABS: 10.0000 mg | ORAL_TABLET | Freq: Every day | ORAL | 0 refills | Status: DC

## 2023-01-24 ENCOUNTER — Ambulatory Visit: Payer: No Typology Code available for payment source | Admitting: Family

## 2023-02-13 ENCOUNTER — Ambulatory Visit: Payer: No Typology Code available for payment source | Admitting: Family

## 2023-02-20 ENCOUNTER — Telehealth: Payer: Self-pay | Admitting: Family

## 2023-02-20 DIAGNOSIS — R41840 Attention and concentration deficit: Secondary | ICD-10-CM

## 2023-02-20 NOTE — Telephone Encounter (Signed)
Patient requesting refill for medications amphetamine-dextroamphetamine (ADDERALL) 10 MG tablet and topiramate (TOPAMAX) 50 MG tablet, requests increase to 100 mg.

## 2023-02-20 NOTE — Telephone Encounter (Signed)
Pt is actually overdue for f/u and these are the two things she was supposed to follow up for so we could determine next steps. Please have her make appt.

## 2023-02-20 NOTE — Telephone Encounter (Signed)
Message sent to the support pool to get pt scheduled.

## 2023-02-21 MED ORDER — AMPHETAMINE-DEXTROAMPHETAMINE 10 MG PO TABS: 10.0000 mg | ORAL_TABLET | Freq: Every day | ORAL | 0 refills | Status: DC

## 2023-02-21 NOTE — Telephone Encounter (Signed)
Refill sent in for adderall.

## 2023-02-21 NOTE — Addendum Note (Signed)
Addended by: Mort Sawyers on: 02/21/2023 03:38 PM   Modules accepted: Orders

## 2023-02-28 ENCOUNTER — Ambulatory Visit: Payer: No Typology Code available for payment source | Admitting: Family

## 2023-03-01 ENCOUNTER — Encounter: Payer: Self-pay | Admitting: Family

## 2023-03-13 ENCOUNTER — Ambulatory Visit (INDEPENDENT_AMBULATORY_CARE_PROVIDER_SITE_OTHER): Payer: No Typology Code available for payment source | Admitting: Family

## 2023-03-13 ENCOUNTER — Encounter: Payer: Self-pay | Admitting: Family

## 2023-03-13 ENCOUNTER — Other Ambulatory Visit (HOSPITAL_COMMUNITY)
Admission: RE | Admit: 2023-03-13 | Discharge: 2023-03-13 | Disposition: A | Discharge status: Home / Self Care | Payer: No Typology Code available for payment source | Source: Ambulatory Visit | Attending: Family | Admitting: Family

## 2023-03-13 VITALS — BP 102/70 | HR 89 | Temp 98.2°F | Ht 62.0 in | Wt 176.4 lb

## 2023-03-13 DIAGNOSIS — E282 Polycystic ovarian syndrome: Secondary | ICD-10-CM

## 2023-03-13 DIAGNOSIS — Z1272 Encounter for screening for malignant neoplasm of vagina: Secondary | ICD-10-CM

## 2023-03-13 DIAGNOSIS — R87612 Low grade squamous intraepithelial lesion on cytologic smear of cervix (LGSIL): Secondary | ICD-10-CM | POA: Insufficient documentation

## 2023-03-13 DIAGNOSIS — R41840 Attention and concentration deficit: Secondary | ICD-10-CM

## 2023-03-13 DIAGNOSIS — F418 Other specified anxiety disorders: Secondary | ICD-10-CM

## 2023-03-13 DIAGNOSIS — R87810 Cervical high risk human papillomavirus (HPV) DNA test positive: Secondary | ICD-10-CM | POA: Insufficient documentation

## 2023-03-13 DIAGNOSIS — G43709 Chronic migraine without aura, not intractable, without status migrainosus: Secondary | ICD-10-CM

## 2023-03-13 DIAGNOSIS — Z01419 Encounter for gynecological examination (general) (routine) without abnormal findings: Secondary | ICD-10-CM | POA: Diagnosis not present

## 2023-03-13 DIAGNOSIS — E611 Iron deficiency: Secondary | ICD-10-CM | POA: Insufficient documentation

## 2023-03-13 DIAGNOSIS — Z1231 Encounter for screening mammogram for malignant neoplasm of breast: Secondary | ICD-10-CM | POA: Diagnosis not present

## 2023-03-13 DIAGNOSIS — E7800 Pure hypercholesterolemia, unspecified: Secondary | ICD-10-CM

## 2023-03-13 DIAGNOSIS — E559 Vitamin D deficiency, unspecified: Secondary | ICD-10-CM

## 2023-03-13 MED ORDER — IRON (FERROUS SULFATE) 325 (65 FE) MG PO TABS: 325.0000 mg | ORAL_TABLET | ORAL | Status: AC

## 2023-03-13 MED ORDER — TOPIRAMATE 100 MG PO TABS: 100.0000 mg | ORAL_TABLET | Freq: Two times a day (BID) | ORAL | 0 refills | Status: DC

## 2023-03-13 MED ORDER — METFORMIN HCL 500 MG PO TABS: 500.0000 mg | ORAL_TABLET | Freq: Two times a day (BID) | ORAL | 0 refills | Status: DC

## 2023-03-13 MED ORDER — AMPHETAMINE-DEXTROAMPHETAMINE 10 MG PO TABS: 10.0000 mg | ORAL_TABLET | Freq: Every day | ORAL | 0 refills | Status: DC

## 2023-03-13 NOTE — Patient Instructions (Addendum)
 I have sent an electronic order over to your preferred location for the following:   []   2D Mammogram  [x]   3D Mammogram  []   Bone Density   Please give this center a call to get scheduled at your convenience.   [x]   [facility]      [address] [place], [place]        [phone]         Make sure to wear two piece  clothing  No lotions powders or deodorants the day of the appointment Make sure to bring picture ID and insurance card.  Bring list of medications you are currently taking including any supplements.

## 2023-03-13 NOTE — Assessment & Plan Note (Signed)
Repeating today pending results.  asymptomatic

## 2023-03-13 NOTE — Assessment & Plan Note (Signed)
Ordered lipid panel, pending results. Work on low cholesterol diet and exercise as tolerated  

## 2023-03-13 NOTE — Assessment & Plan Note (Signed)
 Pt verbalized consent for pelvic exam Declines std testing hpv thin prep ordered and pending results Pap exam in office completed, pt tolerated well.   Patient Counseling(The following topics were reviewed):  Preventative care handout given to pt  Health maintenance and immunizations reviewed. Please refer to Health maintenance section. Pt advised on safe sex, wearing seatbelts in car, and proper nutrition labwork ordered today for annual Dental health: Discussed importance of regular tooth brushing, flossing, and dental visits.

## 2023-03-13 NOTE — Progress Notes (Signed)
Subjective:  Patient ID: Cassie English, female    DOB: 11/09/1980  Age: 42 y.o. MRN: [id number]  Patient Care Team: Mort Sawyers, FNP as PCP - General (Family Medicine)   CC:  Chief Complaint  Patient presents with   Medical Management of Chronic Issues    HPI [name] is a 42 y.o. female who presents today for a well woman exam. She reports consuming a general diet. The patient does not participate in regular exercise at present. She generally feels well. She reports sleeping fairly well. She does not have additional problems to discuss today.   Vision:Within last year Dental:Receives regular dental care  Mammogram: never had one.  Last pap: 24/29/2021, HPV high risk positive LSIL  Menses: break thorough spotting often, occurring for the last 3-4 months. Will have a normal regular period however then will spot about 1-2 weeks after period. She has changed birth controls last pharmacy pick up because they gave her a different brand. On prior just northindrone she did well she states. Not painful.   Pt is without acute concerns.  Migraines, doing well on topamax 100 mg once daily. Noticed migraines are much shorter, will last 10 hours vs a few days. Frequency has significantly decreased, two in the last one month. Used to last longer and would be about four a month.   Denies vaginal discharge, pain with sex, nipple discharge, breast changes to include mass and or rash.   Advanced Directives Patient does not have advanced directives    DEPRESSION SCREENING    03/13/2023    8:00 AM 01/03/2023    8:30 AM  PHQ 2/9 Scores  PHQ - 2 Score 0 2  PHQ- 9 Score 6 12     ROS: Negative unless specifically indicated above in HPI.    Current Outpatient Medications:    acetaminophen (TYLENOL) 500 MG tablet, Take 1,000 mg by mouth every 6 (six) hours as needed for moderate pain., Disp: , Rfl:    Cholecalciferol 1.25 MG (50000 UT) TABS, Take 1 tablet by mouth once a  week., Disp: 8 tablet, Rfl: 0   FLUoxetine (PROZAC) 20 MG capsule, Take 1 capsule daily, 2 capsules during menses, Disp: 105 capsule, Rfl: 3   Iron, Ferrous Sulfate, 325 (65 Fe) MG TABS, Take 325 mg by mouth every other day., Disp: , Rfl:    metFORMIN (GLUCOPHAGE) 500 MG tablet, Take 1 tablet (500 mg total) by mouth 2 (two) times daily with a meal., Disp: 90 tablet, Rfl: 0   norethindrone (ORTHO MICRONOR) 0.35 MG tablet, Take one od for continuous usage, do not use placebos just start new pack, Disp: 112 tablet, Rfl: 3   ondansetron (ZOFRAN-ODT) 4 MG disintegrating tablet, Take 1 tablet (4 mg total) by mouth every 8 (eight) hours as needed for nausea or vomiting., Disp: 10 tablet, Rfl: 0   rizatriptan (MAXALT) 10 MG tablet, Take 1 tablet (10 mg total) by mouth as needed for migraine. May repeat in 2 hours if needed, Disp: 10 tablet, Rfl: 2   topiramate (TOPAMAX) 100 MG tablet, Take 1 tablet (100 mg total) by mouth 2 (two) times daily., Disp: 180 tablet, Rfl: 0   amphetamine-dextroamphetamine (ADDERALL) 10 MG tablet, Take 1 tablet (10 mg total) by mouth daily with breakfast., Disp: 30 tablet, Rfl: 0    Objective:    BP 102/70 (BP Location: Left Arm, Patient Position: Sitting, Cuff Size: Normal)   Pulse 89   Temp 98.2 F (36.8 C) (Temporal)  Ht 5\' 2"  (1.575 m)   Wt 176 lb 6.4 oz (80 kg)   SpO2 97%   BMI 32.26 kg/m   BP Readings from Last 3 Encounters:  03/13/23 102/70  01/03/23 116/84  12/07/21 120/73      @PHYSEXAMBYAGE @  Gen: NAD, resting comfortably Breasts: breasts appear normal, no suspicious masses, no skin or nipple changes or axillary nodes Physical Exam Genitourinary:    General: Normal vulva.     Pubic Area: No rash.      Labia:        Right: No rash, tenderness, lesion or injury.        Left: No rash, tenderness, lesion or injury.      Urethra: No prolapse or urethral pain.     Vagina: Normal. No vaginal discharge, tenderness or lesions.     Cervix: slight  erythema, nontender, clear discharge slight     Rectum: Normal.  Psych: Normal affect and thought content      Assessment & Plan:  Screening mammogram for breast cancer -     3D Screening Mammogram, Left and Right; Future  Encounter for Papanicolaou smear of vagina as part of routine gynecological examination Assessment & Plan: Pt verbalized consent for pelvic exam Declines std testing hpv thin prep ordered and pending results Pap exam in office completed, pt tolerated well.   Patient Counseling(The following topics were reviewed):  Preventative care handout given to pt  Health maintenance and immunizations reviewed. Please refer to Health maintenance section. Pt advised on safe sex, wearing seatbelts in car, and proper nutrition labwork ordered today for annual Dental health: Discussed importance of regular tooth brushing, flossing, and dental visits.    Attention or concentration deficit Assessment & Plan: Doing well, changing to tablets per cost.  PDMP review. Refill adderall XR once daily  Orders: -     Amphetamine-Dextroamphetamine; Take 1 tablet (10 mg total) by mouth daily with breakfast.  Dispense: 30 tablet; Refill: 0  Chronic migraine without aura without status migrainosus, not intractable Assessment & Plan: Improving with topamax 100 mg once daily.  Continue dose as scheduled.  Advised pt can decrease micronor efficacy  Orders: -     Topiramate; Take 1 tablet (100 mg total) by mouth 2 (two) times daily.  Dispense: 180 tablet; Refill: 0  PCOS (polycystic ovarian syndrome) Assessment & Plan: Attempt to add on metformin 500 mg once daily.  Will see if this helps regular menses as well.  Just started new brand, so also will give 3-6 months to evaluate for regulation, it not, then will consider transvaginal u/s  Orders: -     metFORMIN HCl; Take 1 tablet (500 mg total) by mouth 2 (two) times daily with a meal.  Dispense: 90 tablet; Refill: 0  Depression  with anxiety  Elevated LDL cholesterol level Assessment & Plan: Ordered lipid panel, pending results. Work on low cholesterol diet and exercise as tolerated   Orders: -     Lipid panel; Future  Vitamin D deficiency  Cervical high risk HPV (human papillomavirus) test positive Assessment & Plan: Repeating today pending results.  asymptomatic  Orders: -     Cytology - PAP  LGSIL on Pap smear of cervix -     Cytology - PAP  Iron deficiency -     Iron (Ferrous Sulfate); Take 325 mg by mouth every other day.      Follow-up: Return in about 3 months (around 06/11/2023) for f/u ADD medication.   Mort Sawyers, FNP

## 2023-03-13 NOTE — Assessment & Plan Note (Signed)
Doing well, changing to tablets per cost.  PDMP review. Refill adderall XR once daily

## 2023-03-13 NOTE — Assessment & Plan Note (Signed)
Attempt to add on metformin 500 mg once daily.  Will see if this helps regular menses as well.  Just started new brand, so also will give 3-6 months to evaluate for regulation, it not, then will consider transvaginal u/s

## 2023-03-13 NOTE — Assessment & Plan Note (Signed)
Improving with topamax 100 mg once daily.  Continue dose as scheduled.  Advised pt can decrease micronor efficacy

## 2023-03-16 LAB — CYTOLOGY - PAP
Adequacy: ABSENT
Comment: NEGATIVE
Diagnosis: NEGATIVE
High risk HPV: NEGATIVE

## 2023-03-17 ENCOUNTER — Encounter: Payer: Self-pay | Admitting: Family

## 2023-04-23 ENCOUNTER — Other Ambulatory Visit: Payer: Self-pay | Admitting: Family

## 2023-04-23 DIAGNOSIS — E282 Polycystic ovarian syndrome: Secondary | ICD-10-CM

## 2023-05-02 ENCOUNTER — Other Ambulatory Visit: Payer: Self-pay | Admitting: Family

## 2023-05-02 DIAGNOSIS — R41840 Attention and concentration deficit: Secondary | ICD-10-CM

## 2023-05-02 DIAGNOSIS — F418 Other specified anxiety disorders: Secondary | ICD-10-CM

## 2023-05-02 NOTE — Telephone Encounter (Signed)
LOV - 03/13/23 NOV - not scheduled RF - 03/13/23 #30/0

## 2023-05-04 MED ORDER — HYDROXYZINE PAMOATE 25 MG PO CAPS: 25.0000 mg | ORAL_CAPSULE | Freq: Three times a day (TID) | ORAL | 0 refills | Status: DC | PRN

## 2023-05-04 MED ORDER — METHYLPHENIDATE HCL ER (LA) 10 MG PO CP24: 10.0000 mg | ORAL_CAPSULE | Freq: Every day | ORAL | 0 refills | Status: DC

## 2023-05-29 ENCOUNTER — Other Ambulatory Visit: Payer: Self-pay | Admitting: Family

## 2023-05-29 DIAGNOSIS — F418 Other specified anxiety disorders: Secondary | ICD-10-CM

## 2023-07-08 ENCOUNTER — Other Ambulatory Visit: Payer: Self-pay | Admitting: Family

## 2023-07-08 DIAGNOSIS — G43709 Chronic migraine without aura, not intractable, without status migrainosus: Secondary | ICD-10-CM

## 2023-07-13 ENCOUNTER — Ambulatory Visit: Payer: Self-pay | Admitting: *Deleted

## 2023-07-13 ENCOUNTER — Encounter: Payer: Self-pay | Admitting: Family

## 2023-07-13 ENCOUNTER — Ambulatory Visit (INDEPENDENT_AMBULATORY_CARE_PROVIDER_SITE_OTHER): Admitting: Family Medicine

## 2023-07-13 ENCOUNTER — Encounter: Payer: Self-pay | Admitting: Family Medicine

## 2023-07-13 VITALS — BP 108/64 | HR 90 | Temp 97.8°F | Ht 62.0 in | Wt 162.1 lb

## 2023-07-13 DIAGNOSIS — Z87442 Personal history of urinary calculi: Secondary | ICD-10-CM

## 2023-07-13 DIAGNOSIS — R319 Hematuria, unspecified: Secondary | ICD-10-CM | POA: Diagnosis not present

## 2023-07-13 LAB — POC URINALSYSI DIPSTICK (AUTOMATED)
Bilirubin, UA: NEGATIVE
Blood, UA: 200 — AB
Glucose, UA: NEGATIVE
Ketones, UA: NEGATIVE
Leukocytes, UA: NEGATIVE
Nitrite, UA: NEGATIVE
Protein, UA: NEGATIVE
Spec Grav, UA: 1.025 (ref 1.010–1.025)
Urobilinogen, UA: 0.2 U/dL
pH, UA: 6 (ref 5.0–8.0)

## 2023-07-13 MED ORDER — HYDROCODONE-ACETAMINOPHEN 5-325 MG PO TABS: 1.0000 | ORAL_TABLET | Freq: Three times a day (TID) | ORAL | 0 refills | Status: AC | PRN

## 2023-07-13 MED ORDER — TAMSULOSIN HCL 0.4 MG PO CAPS: 0.4000 mg | ORAL_CAPSULE | Freq: Every day | ORAL | 3 refills | Status: DC

## 2023-07-13 NOTE — Telephone Encounter (Signed)
 Pt triaged and has appt with Dr Cherlyn Cornet today

## 2023-07-13 NOTE — Telephone Encounter (Signed)
 Noted.

## 2023-07-13 NOTE — Telephone Encounter (Signed)
 NOTED

## 2023-07-13 NOTE — Progress Notes (Signed)
 Patient ID: Cassie  Albertus English, female    DOB: Mar 26, 1980, 43 y.o.   MRN: [id number]  This visit was conducted in person.  BP 108/64 (BP Location: Right Arm, Patient Position: Sitting, Cuff Size: Normal)   Pulse 90   Temp 97.8 F (36.6 C) (Temporal)   Ht 5\' 2"  (1.575 m)   Wt 162 lb 2 oz (73.5 kg)   SpO2 98%   BMI 29.65 kg/m    CC:  Chief Complaint  Patient presents with   Hematuria    History of kidney stones    Back Pain    Subjective:   HPI: Cassie  N English is a 43 y.o. female presenting on 07/13/2023 for Hematuria (History of kidney stones ) and Back Pain  Hematuria This is a new problem. The current episode started today (she has noted darker urine in last 3-4 days). The problem has been waxing and waning since onset. She describes the hematuria as gross hematuria. Her pain is at a severity of 9/10 (sudden onset pain at 1:30 this monring... severe pain resolved 1 hour later now bilateral back pain). The pain is moderate. She describes her urine color as light pink. Irritative symptoms include frequency and urgency. Irritative symptoms do not include nocturia. Associated symptoms include flank pain and vomiting. Pertinent negatives include no abdominal pain, chills, dysuria, fever or hesitancy. ( Urgency and now some stinging with urination) Her past medical history is significant for kidney stones. Risk factors: not on blood thinner, no CKD.  Back Pain Pertinent negatives include no abdominal pain, chest pain, dysuria, fever or headaches.     Currently 6/10 pain.. R> L     Has seen [facility] in past.   She has since been able to eat and keep up liquids.   Relevant past medical, surgical, family and social history reviewed and updated as indicated. Interim medical history since our last visit reviewed. Allergies and medications reviewed and updated. Outpatient Medications Prior to Visit  Medication Sig Dispense Refill   acetaminophen  (TYLENOL ) 500 MG tablet  Take 1,000 mg by mouth every 6 (six) hours as needed for moderate pain.     Cholecalciferol  1.25 MG (50000 UT) TABS Take 1 tablet by mouth once a week. 8 tablet 0   FLUoxetine  (PROZAC ) 20 MG capsule Take 1 capsule daily, 2 capsules during menses 105 capsule 3   hydrOXYzine  (VISTARIL ) 25 MG capsule TAKE ONE CAPSULE BY MOUTH EVERY 8 HOURS AS NEEDED 30 capsule 0   Iron , Ferrous Sulfate , 325 (65 Fe) MG TABS Take 325 mg by mouth every other day.     methylphenidate  (RITALIN  LA) 10 MG 24 hr capsule Take 1 capsule (10 mg total) by mouth daily. 30 capsule 0   norethindrone  (ORTHO MICRONOR ) 0.35 MG tablet Take one od for continuous usage, do not use placebos just start new pack 112 tablet 3   ondansetron  (ZOFRAN -ODT) 4 MG disintegrating tablet Take 1 tablet (4 mg total) by mouth every 8 (eight) hours as needed for nausea or vomiting. 10 tablet 0   rizatriptan  (MAXALT ) 10 MG tablet Take 1 tablet (10 mg total) by mouth as needed for migraine. May repeat in 2 hours if needed 10 tablet 2   topiramate  (TOPAMAX ) 100 MG tablet TAKE ONE TABLET BY MOUTH TWICE A DAY 180 tablet 0   metFORMIN  (GLUCOPHAGE ) 500 MG tablet TAKE ONE TABLET BY MOUTH TWICE A DAY WITH A MEAL 180 tablet 0   amphetamine -dextroamphetamine  (ADDERALL) 10 MG tablet Take 1 tablet (  10 mg total) by mouth daily with breakfast. 30 tablet 0   No facility-administered medications prior to visit.     Per HPI unless specifically indicated in ROS section below Review of Systems  Constitutional:  Negative for chills, fatigue and fever.  HENT:  Negative for congestion.   Eyes:  Negative for pain.  Respiratory:  Negative for cough and shortness of breath.   Cardiovascular:  Negative for chest pain, palpitations and leg swelling.  Gastrointestinal:  Positive for vomiting. Negative for abdominal pain.  Genitourinary:  Positive for flank pain, frequency, hematuria and urgency. Negative for dysuria, hesitancy, nocturia and vaginal bleeding.  Musculoskeletal:   Positive for back pain.  Neurological:  Negative for syncope, light-headedness and headaches.  Psychiatric/Behavioral:  Negative for dysphoric mood.    Objective:  BP 108/64 (BP Location: Right Arm, Patient Position: Sitting, Cuff Size: Normal)   Pulse 90   Temp 97.8 F (36.6 C) (Temporal)   Ht 5\' 2"  (1.575 m)   Wt 162 lb 2 oz (73.5 kg)   SpO2 98%   BMI 29.65 kg/m   Wt Readings from Last 3 Encounters:  07/13/23 162 lb 2 oz (73.5 kg)  03/13/23 176 lb 6.4 oz (80 kg)  01/03/23 175 lb (79.4 kg)      Physical Exam Constitutional:      General: She is not in acute distress.    Appearance: Normal appearance. She is well-developed. She is not ill-appearing or toxic-appearing.  HENT:     Head: Normocephalic.     Right Ear: Hearing, tympanic membrane, ear canal and external ear normal. Tympanic membrane is not erythematous, retracted or bulging.     Left Ear: Hearing, tympanic membrane, ear canal and external ear normal. Tympanic membrane is not erythematous, retracted or bulging.     Nose: No mucosal edema or rhinorrhea.     Right Sinus: No maxillary sinus tenderness or frontal sinus tenderness.     Left Sinus: No maxillary sinus tenderness or frontal sinus tenderness.     Mouth/Throat:     Pharynx: Uvula midline.  Eyes:     General: Lids are normal. Lids are everted, no foreign bodies appreciated.     Conjunctiva/sclera: Conjunctivae normal.     Pupils: Pupils are equal, round, and reactive to light.  Neck:     Thyroid: No thyroid mass or thyromegaly.     Vascular: No carotid bruit.     Trachea: Trachea normal.  Cardiovascular:     Rate and Rhythm: Normal rate and regular rhythm.     Pulses: Normal pulses.     Heart sounds: Normal heart sounds, S1 normal and S2 normal. No murmur heard.    No friction rub. No gallop.  Pulmonary:     Effort: Pulmonary effort is normal. No tachypnea or respiratory distress.     Breath sounds: Normal breath sounds. No decreased breath sounds,  wheezing, rhonchi or rales.  Abdominal:     General: Bowel sounds are normal.     Palpations: Abdomen is soft.     Tenderness: There is abdominal tenderness in the right lower quadrant. There is right CVA tenderness and left CVA tenderness. There is no guarding or rebound.  Musculoskeletal:     Cervical back: Normal range of motion and neck supple.  Skin:    General: Skin is warm and dry.     Findings: No rash.  Neurological:     Mental Status: She is alert.  Psychiatric:  Mood and Affect: Mood is not anxious or depressed.        Speech: Speech normal.        Behavior: Behavior normal. Behavior is cooperative.        Thought Content: Thought content normal.        Judgment: Judgment normal.       Results for orders placed or performed in visit on 07/13/23  POCT Urinalysis Dipstick (Automated)   Collection Time: 07/13/23  3:04 PM  Result Value Ref Range   Color, UA Yellow    Clarity, UA Hazy    Glucose, UA Negative Negative   Bilirubin, UA Negative    Ketones, UA Negative    Spec Grav, UA 1.025 1.010 - 1.025   Blood, UA 200 Ery/uL (A)    pH, UA 6.0 5.0 - 8.0   Protein, UA Negative Negative   Urobilinogen, UA 0.2 0.2 or 1.0 E.U./dL   Nitrite, UA Negative    Leukocytes, UA Negative Negative    Assessment and Plan  Hematuria, unspecified type -     POCT Urinalysis Dipstick (Automated)  History of kidney stones  Other orders -     Tamsulosin  HCl; Take 1 capsule (0.4 mg total) by mouth daily.  Dispense: 30 capsule; Refill: 3 -     HYDROcodone -Acetaminophen ; Take 1-2 tablets by mouth every 8 (eight) hours as needed for up to 5 days for moderate pain (pain score 4-6).  Dispense: 15 tablet; Refill: 0   Most likely cause of hematuria and flank pain is acute kidney stone.  Her history suggests that she may have had a stone that passed at least partially earlier today given her pain is improved.  There is no evidence of infection in her urine but we will send urine for  culture to verify. She feels her pain is fairly consistent with past kidney stones.  We discussed options of immediate CT scan to assess size of stone versus trying to pass any remaining stones with pain control, water increase and Flomax . Given limited funding she would like to hold off on CT scan unless it is necessary. We will treat with Flomax  0.4 mg daily, increase water intake significantly and control pain with hydrocodone  1 to 2 tablets every 8 hours as needed for pain.  She will contact us  early next week if pain is not improving for CT scan abdomen and pelvis to evaluate for stone. If she has fever, severe pain or is unable to keep down liquids she will go immediately to the emergency room. She will consider contacting her urologist if she has persistent symptoms.  We will have her see them if any stone seen on CT is greater than 5 mm. No follow-ups on file.   Herby Lolling, MD

## 2023-07-13 NOTE — Telephone Encounter (Signed)
  Chief Complaint: blood in urine, back pain  Symptoms: nausea this am , blood in urine and wiping. Back pain woke up out of sleep this am.  Frequency: this am  Pertinent Negatives: Patient denies fever Disposition: [] ED /[] Urgent Care (no appt availability in office) / [x] Appointment(In office/virtual)/ []  [facility]/ [] Home Care/ [] Refused Recommended Disposition /[] [facility]/ []  Follow-up with PCP Additional Notes:   Appt scheduled for today with other provider. None available with PCP.      Copied from CRM [id number]. Topic: Clinical - Red Word Triage >> Jul 13, 2023 10:45 AM Freya Jesus wrote: Red Word that prompted transfer to Nurse Triage: Patient stated she woke up with severe back pain, feeling nauseous and blood in urine. Reason for Disposition  [1] Pain or burning with passing urine AND [2] side (flank) or back pain present  Answer Assessment - Initial Assessment Questions 1. COLOR of URINE: "Describe the color of the urine."  (e.g., tea-colored, pink, red, bloody) "Do you have blood clots in your urine?" (e.g., none, pea, grape, small coin)     Blood in urine  2. ONSET: "When did the bleeding start?"      This am  3. EPISODES: "How many times has there been blood in the urine?" or "How many times today?"     Today  4. PAIN with URINATION: "Is there any pain with passing your urine?" If Yes, ask: "How bad is the pain?"  (Scale 1-10; or mild, moderate, severe)    - MILD: Complains slightly about urination hurting.    - MODERATE: Interferes with normal activities.      - SEVERE: Excruciating, unwilling or unable to urinate because of the pain.      Pain level 5/10 5. FEVER: "Do you have a fever?" If Yes, ask: "What is your temperature, how was it measured, and when did it start?"     no 6. ASSOCIATED SYMPTOMS: "Are you passing urine more frequently than usual?"     na 7. OTHER SYMPTOMS: "Do you have any other symptoms?" (e.g., back/flank pain, abdomen pain,  vomiting)     Back pain blood in urine, nausea  8. PREGNANCY: "Is there any chance you are pregnant?" "When was your last menstrual period?"     na  Protocols used: Urine - Blood In-A-AH

## 2023-07-14 LAB — URINE CULTURE
MICRO NUMBER:: 16400941
Result:: NO GROWTH
SPECIMEN QUALITY:: ADEQUATE

## 2023-07-18 ENCOUNTER — Encounter: Payer: Self-pay | Admitting: Family Medicine

## 2023-07-18 ENCOUNTER — Encounter (HOSPITAL_BASED_OUTPATIENT_CLINIC_OR_DEPARTMENT_OTHER): Payer: Self-pay

## 2023-07-18 ENCOUNTER — Emergency Department (HOSPITAL_BASED_OUTPATIENT_CLINIC_OR_DEPARTMENT_OTHER)
Admission: EM | Admit: 2023-07-18 | Discharge: 2023-07-19 | Disposition: A | Discharge status: Home / Self Care | Payer: PRIVATE HEALTH INSURANCE | Source: Home / Self Care | Attending: Emergency Medicine | Admitting: Emergency Medicine

## 2023-07-18 ENCOUNTER — Emergency Department (HOSPITAL_BASED_OUTPATIENT_CLINIC_OR_DEPARTMENT_OTHER): Payer: PRIVATE HEALTH INSURANCE

## 2023-07-18 ENCOUNTER — Other Ambulatory Visit: Payer: Self-pay

## 2023-07-18 ENCOUNTER — Other Ambulatory Visit: Payer: Self-pay | Admitting: Family Medicine

## 2023-07-18 DIAGNOSIS — N202 Calculus of kidney with calculus of ureter: Secondary | ICD-10-CM | POA: Insufficient documentation

## 2023-07-18 DIAGNOSIS — F411 Generalized anxiety disorder: Secondary | ICD-10-CM | POA: Diagnosis not present

## 2023-07-18 DIAGNOSIS — Z87442 Personal history of urinary calculi: Secondary | ICD-10-CM

## 2023-07-18 DIAGNOSIS — N200 Calculus of kidney: Secondary | ICD-10-CM

## 2023-07-18 DIAGNOSIS — R310 Gross hematuria: Secondary | ICD-10-CM

## 2023-07-18 DIAGNOSIS — F32A Depression, unspecified: Secondary | ICD-10-CM | POA: Diagnosis not present

## 2023-07-18 DIAGNOSIS — D649 Anemia, unspecified: Secondary | ICD-10-CM | POA: Diagnosis not present

## 2023-07-18 DIAGNOSIS — N132 Hydronephrosis with renal and ureteral calculous obstruction: Secondary | ICD-10-CM | POA: Diagnosis present

## 2023-07-18 HISTORY — DX: Calculus of kidney: N20.0

## 2023-07-18 LAB — CBC WITH DIFFERENTIAL/PLATELET
Abs Immature Granulocytes: 0.02 10*3/uL (ref 0.00–0.07)
Basophils Absolute: 0 10*3/uL (ref 0.0–0.1)
Basophils Relative: 0 %
Eosinophils Absolute: 0.1 10*3/uL (ref 0.0–0.5)
Eosinophils Relative: 2 %
HCT: 40.6 % (ref 36.0–46.0)
Hemoglobin: 13.1 g/dL (ref 12.0–15.0)
Immature Granulocytes: 0 %
Lymphocytes Relative: 26 %
Lymphs Abs: 1.8 10*3/uL (ref 0.7–4.0)
MCH: 27.8 pg (ref 26.0–34.0)
MCHC: 32.3 g/dL (ref 30.0–36.0)
MCV: 86.2 fL (ref 80.0–100.0)
Monocytes Absolute: 0.8 10*3/uL (ref 0.1–1.0)
Monocytes Relative: 11 %
Neutro Abs: 4.3 10*3/uL (ref 1.7–7.7)
Neutrophils Relative %: 61 %
Platelets: 226 10*3/uL (ref 150–400)
RBC: 4.71 MIL/uL (ref 3.87–5.11)
RDW: 13.4 % (ref 11.5–15.5)
WBC: 7 10*3/uL (ref 4.0–10.5)
nRBC: 0 % (ref 0.0–0.2)

## 2023-07-18 LAB — COMPREHENSIVE METABOLIC PANEL WITH GFR
ALT: 11 U/L (ref 0–44)
AST: 17 U/L (ref 15–41)
Albumin: 4.3 g/dL (ref 3.5–5.0)
Alkaline Phosphatase: 73 U/L (ref 38–126)
Anion gap: 12 (ref 5–15)
BUN: 13 mg/dL (ref 6–20)
CO2: 22 mmol/L (ref 22–32)
Calcium: 9.5 mg/dL (ref 8.9–10.3)
Chloride: 105 mmol/L (ref 98–111)
Creatinine, Ser: 0.78 mg/dL (ref 0.44–1.00)
GFR, Estimated: >60 mL/min (ref >60)
Glucose, Bld: 100 mg/dL — ABNORMAL HIGH (ref 70–99)
Potassium: 3.5 mmol/L (ref 3.5–5.1)
Sodium: 139 mmol/L (ref 135–145)
Total Bilirubin: 0.2 mg/dL (ref 0.0–1.2)
Total Protein: 7.3 g/dL (ref 6.5–8.1)

## 2023-07-18 LAB — URINALYSIS, MICROSCOPIC (REFLEX)

## 2023-07-18 LAB — URINALYSIS, ROUTINE W REFLEX MICROSCOPIC
Bilirubin Urine: NEGATIVE
Glucose, UA: NEGATIVE mg/dL
Ketones, ur: NEGATIVE mg/dL
Nitrite: NEGATIVE
Protein, ur: NEGATIVE mg/dL
Specific Gravity, Urine: 1.015 (ref 1.005–1.030)
pH: 7 (ref 5.0–8.0)

## 2023-07-18 LAB — PREGNANCY, URINE: Preg Test, Ur: NEGATIVE

## 2023-07-18 MED ORDER — ONDANSETRON HCL 4 MG/2ML IJ SOLN
4.0000 mg | Freq: Once | INTRAMUSCULAR | Status: AC
  Administered 2023-07-19: 4 mg via INTRAVENOUS
  Filled 2023-07-18: qty 2

## 2023-07-18 MED ORDER — FENTANYL CITRATE PF 50 MCG/ML IJ SOSY
50.0000 ug | PREFILLED_SYRINGE | Freq: Once | INTRAMUSCULAR | Status: AC
  Administered 2023-07-19: 50 ug via INTRAVENOUS
  Filled 2023-07-18: qty 1

## 2023-07-18 NOTE — ED Provider Notes (Signed)
 [place] [facility]  Provider Note  CSN: [id number] Arrival date & time: 07/18/23 2149  History Chief Complaint  Patient presents with   Flank Pain    Cassie  BRENISHA English is a 43 y.o. female with history of renal stones, last in 2018 required lithotripsy and stent reports 5 days of intermittent R flank pain. Went to PCP and had hematuria, started on hydrocodone  and flomax  without improvement. She denies any fever or dysuria.    Home Medications Prior to Admission medications   Medication Sig Start Date End Date Taking? Authorizing Provider  naproxen (NAPROSYN) 500 MG tablet Take 1 tablet (500 mg total) by mouth 2 (two) times daily. 07/19/23  Yes Charmayne Cooper, MD  oxyCODONE-acetaminophen  (PERCOCET/ROXICET) 5-325 MG tablet Take 1 tablet by mouth every 6 (six) hours as needed for severe pain (pain score 7-10). 07/19/23  Yes Charmayne Cooper, MD  acetaminophen  (TYLENOL ) 500 MG tablet Take 1,000 mg by mouth every 6 (six) hours as needed for moderate pain.    [provider]  amphetamine -dextroamphetamine  (ADDERALL) 10 MG tablet Take 1 tablet (10 mg total) by mouth daily with breakfast. 03/13/23 04/12/23  Felicita Horns, FNP  Cholecalciferol  1.25 MG (50000 UT) TABS Take 1 tablet by mouth once a week. 01/03/23   Dugal, Tabitha, FNP  FLUoxetine  (PROZAC ) 20 MG capsule Take 1 capsule daily, 2 capsules during menses 01/03/23   Dugal, Tabitha, FNP  hydrOXYzine  (VISTARIL ) 25 MG capsule TAKE ONE CAPSULE BY MOUTH EVERY 8 HOURS AS NEEDED 05/30/23   Felicita Horns, FNP  Iron , Ferrous Sulfate , 325 (65 Fe) MG TABS Take 325 mg by mouth every other day. 03/13/23   Felicita Horns, FNP  methylphenidate  (RITALIN  LA) 10 MG 24 hr capsule Take 1 capsule (10 mg total) by mouth daily. 05/04/23   Felicita Horns, FNP  norethindrone  (ORTHO MICRONOR ) 0.35 MG tablet Take one od for continuous usage, do not use placebos just start new pack 01/03/23   Felicita Horns, FNP   ondansetron  (ZOFRAN -ODT) 4 MG disintegrating tablet Take 1 tablet (4 mg total) by mouth every 8 (eight) hours as needed for nausea or vomiting. 01/03/23   Dugal, Tabitha, FNP  rizatriptan  (MAXALT ) 10 MG tablet Take 1 tablet (10 mg total) by mouth as needed for migraine. May repeat in 2 hours if needed 01/03/23   Felicita Horns, FNP  tamsulosin  (FLOMAX ) 0.4 MG CAPS capsule Take 1 capsule (0.4 mg total) by mouth daily. 07/13/23   Bedsole, Amy E, MD  topiramate  (TOPAMAX ) 100 MG tablet TAKE ONE TABLET BY MOUTH TWICE A DAY 07/10/23   Felicita Horns, FNP     Allergies    Vyvanse [lisdexamfetamine], Codeine, Nubain [nalbuphine hcl], and Phenergan [promethazine hcl]   Review of Systems   Review of Systems Please see HPI for pertinent positives and negatives  Physical Exam BP (!) 139/98 (BP Location: Right Arm)   Pulse 84   Temp 98.6 F (37 C)   Resp 20   Ht 5\' 2"  (1.575 m)   Wt 73 kg   LMP 06/15/2023 (Exact Date)   SpO2 99%   BMI 29.44 kg/m   Physical Exam Vitals and nursing note reviewed.  Constitutional:      Appearance: Normal appearance.  HENT:     Head: Normocephalic and atraumatic.     Nose: Nose normal.     Mouth/Throat:     Mouth: Mucous membranes are moist.  Eyes:     Extraocular Movements: Extraocular movements intact.  Conjunctiva/sclera: Conjunctivae normal.  Cardiovascular:     Rate and Rhythm: Normal rate.  Pulmonary:     Effort: Pulmonary effort is normal.     Breath sounds: Normal breath sounds.  Abdominal:     General: Abdomen is flat.     Palpations: Abdomen is soft.     Tenderness: There is no abdominal tenderness.  Musculoskeletal:        General: No swelling. Normal range of motion.     Cervical back: Neck supple.  Skin:    General: Skin is warm and dry.  Neurological:     General: No focal deficit present.     Mental Status: She is alert.  Psychiatric:        Mood and Affect: Mood normal.     ED Results / Procedures / Treatments    EKG None  Procedures Procedures  Medications Ordered in the ED Medications  fentaNYL  (SUBLIMAZE ) injection 50 mcg (50 mcg Intravenous Given 07/19/23 0004)  ondansetron  (ZOFRAN ) injection 4 mg (4 mg Intravenous Given 07/19/23 0003)  ketorolac  (TORADOL ) 30 MG/ML injection 30 mg (30 mg Intravenous Given 07/19/23 0034)    Initial Impression and Plan  Patient here with symptoms consistent with renal colic, not improved with norco and flomax . Labs done in triage show unremarkable CBC, CMP, UA with blood but no signs of infection. I personally viewed the images from radiology studies and agree with radiologist interpretation: CT shows a 6mm proximal R sided ureteral stone as the cause of her pain. Additional intrarenal stones on the left noted. Will give pain/nausea meds and discuss management with urology  ED Course   Clinical Course as of 07/19/23 0120  Wed Jul 19, 2023  0028 Spoke with Dr. Dulcy Gibney who advises toradol  is not contraindicated, any potential procedures would be later in the week.  [CS]  0118 Patient reports pain is improved. Will d/c with Rx for naprosyn, percocet and outpatient Urology follow up. Recommend she RTED, preferably [facility] if pain is not controlled, she begins running a fever, vomiting or any other concerns.  [CS]    Clinical Course User Index [CS] Charmayne Cooper, MD     MDM Rules/Calculators/A&P Medical Decision Making Problems Addressed: Kidney stone: acute illness or injury  Amount and/or Complexity of Data Reviewed Labs: ordered. Decision-making details documented in ED Course. Radiology: ordered and independent interpretation performed. Decision-making details documented in ED Course.  Risk Prescription drug management. Parenteral controlled substances.     Final Clinical Impression(s) / ED Diagnoses Final diagnoses:  Kidney stone    Rx / DC Orders ED Discharge Orders          Ordered    oxyCODONE-acetaminophen  (PERCOCET/ROXICET) 5-325 MG  tablet  Every 6 hours PRN        07/19/23 0119    naproxen (NAPROSYN) 500 MG tablet  2 times daily        07/19/23 0119             Charmayne Cooper, MD 07/19/23 0120

## 2023-07-18 NOTE — ED Triage Notes (Signed)
 Pt states she is having rt. Sided flank and back pain Hx of kidney stones Had sx 2018 Was seen at [facility] last Thursday  Given Flomax  and Vicodin and told her if she did not pass them to return to nearest ED

## 2023-07-19 ENCOUNTER — Ambulatory Visit (HOSPITAL_COMMUNITY)
Admission: EM | Admit: 2023-07-19 | Discharge: 2023-07-19 | Disposition: A | Discharge status: Home / Self Care | Payer: PRIVATE HEALTH INSURANCE | Attending: Emergency Medicine | Admitting: Emergency Medicine

## 2023-07-19 ENCOUNTER — Other Ambulatory Visit: Payer: Self-pay

## 2023-07-19 ENCOUNTER — Emergency Department (HOSPITAL_COMMUNITY): Payer: PRIVATE HEALTH INSURANCE

## 2023-07-19 ENCOUNTER — Emergency Department (HOSPITAL_COMMUNITY): Payer: PRIVATE HEALTH INSURANCE | Admitting: Anesthesiology

## 2023-07-19 ENCOUNTER — Encounter (HOSPITAL_COMMUNITY)
Admission: EM | Disposition: A | Discharge status: Home / Self Care | Payer: Self-pay | Source: Home / Self Care | Attending: Emergency Medicine

## 2023-07-19 ENCOUNTER — Emergency Department (HOSPITAL_BASED_OUTPATIENT_CLINIC_OR_DEPARTMENT_OTHER): Payer: PRIVATE HEALTH INSURANCE | Admitting: Anesthesiology

## 2023-07-19 ENCOUNTER — Encounter (HOSPITAL_COMMUNITY): Payer: Self-pay

## 2023-07-19 DIAGNOSIS — N132 Hydronephrosis with renal and ureteral calculous obstruction: Secondary | ICD-10-CM | POA: Diagnosis not present

## 2023-07-19 DIAGNOSIS — D649 Anemia, unspecified: Secondary | ICD-10-CM | POA: Insufficient documentation

## 2023-07-19 DIAGNOSIS — N200 Calculus of kidney: Secondary | ICD-10-CM | POA: Diagnosis not present

## 2023-07-19 DIAGNOSIS — F32A Depression, unspecified: Secondary | ICD-10-CM | POA: Insufficient documentation

## 2023-07-19 DIAGNOSIS — F411 Generalized anxiety disorder: Secondary | ICD-10-CM | POA: Insufficient documentation

## 2023-07-19 HISTORY — PX: CYSTOSCOPY WITH STENT PLACEMENT: SHX5790

## 2023-07-19 SURGERY — CYSTOSCOPY, WITH STENT INSERTION
Anesthesia: General | Laterality: Right

## 2023-07-19 MED ORDER — TAMSULOSIN HCL 0.4 MG PO CAPS: 0.4000 mg | ORAL_CAPSULE | Freq: Every day | ORAL | 0 refills | Status: DC

## 2023-07-19 MED ORDER — FENTANYL CITRATE (PF) 100 MCG/2ML IJ SOLN
INTRAMUSCULAR | Status: DC | PRN
  Administered 2023-07-19: 50 ug via INTRAVENOUS

## 2023-07-19 MED ORDER — ORAL CARE MOUTH RINSE: 15.0000 mL | Freq: Once | OROMUCOSAL | Status: DC

## 2023-07-19 MED ORDER — DEXAMETHASONE SODIUM PHOSPHATE 10 MG/ML IJ SOLN
INTRAMUSCULAR | Status: AC
  Filled 2023-07-19: qty 1

## 2023-07-19 MED ORDER — FENTANYL CITRATE PF 50 MCG/ML IJ SOSY: 25.0000 ug | PREFILLED_SYRINGE | INTRAMUSCULAR | Status: DC | PRN

## 2023-07-19 MED ORDER — SODIUM CHLORIDE 0.9 % IR SOLN
Status: DC | PRN
Start: 2023-07-19 — End: 2023-07-19
  Administered 2023-07-19: 3000 mL via INTRAVESICAL

## 2023-07-19 MED ORDER — FENTANYL CITRATE (PF) 100 MCG/2ML IJ SOLN
INTRAMUSCULAR | Status: AC
Start: 2023-07-19 — End: ?
  Filled 2023-07-19: qty 2

## 2023-07-19 MED ORDER — OXYCODONE HCL 5 MG PO TABS: 5.0000 mg | ORAL_TABLET | Freq: Once | ORAL | Status: DC | PRN

## 2023-07-19 MED ORDER — ONDANSETRON HCL 4 MG/2ML IJ SOLN
4.0000 mg | Freq: Four times a day (QID) | INTRAMUSCULAR | Status: AC | PRN
  Administered 2023-07-19: 4 mg via INTRAVENOUS

## 2023-07-19 MED ORDER — AMISULPRIDE (ANTIEMETIC) 5 MG/2ML IV SOLN
INTRAVENOUS | Status: AC
  Filled 2023-07-19: qty 4

## 2023-07-19 MED ORDER — LIDOCAINE HCL (PF) 2 % IJ SOLN
INTRAMUSCULAR | Status: AC
  Filled 2023-07-19: qty 5

## 2023-07-19 MED ORDER — LIDOCAINE HCL (CARDIAC) PF 100 MG/5ML IV SOSY
PREFILLED_SYRINGE | INTRAVENOUS | Status: DC | PRN
  Administered 2023-07-19: 50 mg via INTRAVENOUS

## 2023-07-19 MED ORDER — AMISULPRIDE (ANTIEMETIC) 5 MG/2ML IV SOLN
10.0000 mg | Freq: Once | INTRAVENOUS | Status: AC
  Administered 2023-07-19: 10 mg via INTRAVENOUS

## 2023-07-19 MED ORDER — MIDAZOLAM HCL 2 MG/2ML IJ SOLN
INTRAMUSCULAR | Status: DC | PRN
  Administered 2023-07-19: 2 mg via INTRAVENOUS

## 2023-07-19 MED ORDER — ONDANSETRON HCL 4 MG/2ML IJ SOLN
INTRAMUSCULAR | Status: DC | PRN
  Administered 2023-07-19: 4 mg via INTRAVENOUS

## 2023-07-19 MED ORDER — ONDANSETRON HCL 4 MG/2ML IJ SOLN
4.0000 mg | Freq: Once | INTRAMUSCULAR | Status: AC
  Administered 2023-07-19: 4 mg via INTRAVENOUS
  Filled 2023-07-19: qty 2

## 2023-07-19 MED ORDER — NAPROXEN 500 MG PO TABS: 500.0000 mg | ORAL_TABLET | Freq: Two times a day (BID) | ORAL | 0 refills | Status: DC

## 2023-07-19 MED ORDER — ONDANSETRON HCL 4 MG/2ML IJ SOLN
INTRAMUSCULAR | Status: AC
  Filled 2023-07-19: qty 2

## 2023-07-19 MED ORDER — OXYCODONE HCL 5 MG/5ML PO SOLN: 5.0000 mg | Freq: Once | ORAL | Status: DC | PRN

## 2023-07-19 MED ORDER — PROPOFOL 10 MG/ML IV BOLUS
INTRAVENOUS | Status: AC
  Filled 2023-07-19: qty 20

## 2023-07-19 MED ORDER — LACTATED RINGERS IV SOLN: INTRAVENOUS | Status: DC

## 2023-07-19 MED ORDER — HYOSCYAMINE SULFATE 0.125 MG PO TBDP: 0.1250 mg | ORAL_TABLET | Freq: Four times a day (QID) | ORAL | 0 refills | Status: DC | PRN

## 2023-07-19 MED ORDER — SUCCINYLCHOLINE CHLORIDE 200 MG/10ML IV SOSY
PREFILLED_SYRINGE | INTRAVENOUS | Status: DC | PRN
  Administered 2023-07-19: 100 mg via INTRAVENOUS

## 2023-07-19 MED ORDER — KETOROLAC TROMETHAMINE 30 MG/ML IJ SOLN
30.0000 mg | Freq: Once | INTRAMUSCULAR | Status: AC
  Administered 2023-07-19: 30 mg via INTRAVENOUS
  Filled 2023-07-19: qty 1

## 2023-07-19 MED ORDER — PROPOFOL 10 MG/ML IV BOLUS
INTRAVENOUS | Status: DC | PRN
  Administered 2023-07-19: 170 mg via INTRAVENOUS

## 2023-07-19 MED ORDER — OXYCODONE-ACETAMINOPHEN 5-325 MG PO TABS: 1.0000 | ORAL_TABLET | Freq: Four times a day (QID) | ORAL | 0 refills | Status: DC | PRN

## 2023-07-19 MED ORDER — HYDROMORPHONE HCL 1 MG/ML IJ SOLN
0.5000 mg | Freq: Once | INTRAMUSCULAR | Status: AC
  Administered 2023-07-19: 0.5 mg via INTRAVENOUS
  Filled 2023-07-19: qty 1

## 2023-07-19 MED ORDER — DEXAMETHASONE SODIUM PHOSPHATE 10 MG/ML IJ SOLN
INTRAMUSCULAR | Status: DC | PRN
  Administered 2023-07-19: 4 mg via INTRAVENOUS

## 2023-07-19 MED ORDER — CHLORHEXIDINE GLUCONATE 0.12 % MT SOLN
15.0000 mL | Freq: Once | OROMUCOSAL | Status: DC
  Filled 2023-07-19: qty 15

## 2023-07-19 MED ORDER — MIDAZOLAM HCL 2 MG/2ML IJ SOLN
INTRAMUSCULAR | Status: AC
  Filled 2023-07-19: qty 2

## 2023-07-19 MED ORDER — CEFAZOLIN SODIUM-DEXTROSE 2-3 GM-%(50ML) IV SOLR
INTRAVENOUS | Status: DC | PRN
  Administered 2023-07-19: 2 g via INTRAVENOUS

## 2023-07-19 MED ORDER — IOHEXOL 300 MG/ML  SOLN
INTRAMUSCULAR | Status: DC | PRN
  Administered 2023-07-19: 7 mL

## 2023-07-19 MED ORDER — CEFAZOLIN SODIUM-DEXTROSE 2-4 GM/100ML-% IV SOLN
INTRAVENOUS | Status: AC
  Filled 2023-07-19: qty 100

## 2023-07-19 SURGICAL SUPPLY — 11 items
BAG URO CATCHER STRL LF (MISCELLANEOUS) ×1 IMPLANT
CATH URETL OPEN END 6FR 70 (CATHETERS) ×1 IMPLANT
CLOTH BEACON ORANGE TIMEOUT ST (SAFETY) ×1 IMPLANT
GLOVE BIO SURGEON STRL SZ 6.5 (GLOVE) ×1 IMPLANT
GOWN STRL REUS W/ TWL LRG LVL3 (GOWN DISPOSABLE) ×1 IMPLANT
GUIDEWIRE STR DUAL SENSOR (WIRE) ×1 IMPLANT
KIT TURNOVER KIT A (KITS) IMPLANT
MANIFOLD NEPTUNE II (INSTRUMENTS) ×1 IMPLANT
PACK CYSTO (CUSTOM PROCEDURE TRAY) ×1 IMPLANT
STENT URET 6FRX24 CONTOUR (STENTS) IMPLANT
TUBING CONNECTING 10 (TUBING) ×1 IMPLANT

## 2023-07-19 NOTE — Discharge Instructions (Addendum)
Post stent placement instructions   Definitions:  Ureter: The duct that transports urine from the kidney to the bladder. Stent: A plastic hollow tube that is placed into the ureter, from the kidney to the bladder to prevent the ureter from swelling shut.  General instructions:  Despite the fact that no skin incisions were used, the area around the ureter and bladder is raw and irritated. The stent is a foreign body which can further irritate the bladder wall. This irritation is manifested by increased frequency of urination, both day and night, and by an increase in the urge to urinate. In some, the urge to urinate is present almost always. Sometimes the urge is strong enough that you may not be able to stop your self from urinating. This can often be controlled with medication but does not occur in everyone. A stent can safely be left in place for 3 months or greater.  You may see some blood in your urine while the stent is in place and a few days afterward. Do not be alarmed, even if the urine is clear for a while. Get off your feet and drink lots of fluids until clearing occurs. If you start to pass clots or don't improve, call us.  Diet:  You may return to your normal diet immediately. Because of the raw surface of your bladder, alcohol, spicy foods, foods high in acid and drinks with caffeine may cause irritation or frequency and should be used in moderation. To keep your urine flowing freely and avoid constipation, drink plenty of fluids during the day (8-10 glasses). Tip: Avoid cranberry juice because it is very acidic.  Activity:  Your physical activity doesn't need to be restricted. However, if you are very active, you may see some blood in the urine. We suggest that you reduce your activity under the circumstances until the bleeding has stopped.  Bowels:  It is important to keep your bowels regular during the postoperative period. Straining with bowel movements can cause bleeding. A  bowel movement every other day is reasonable. Use a mild laxative if needed, such as milk of magnesia 2-3 tablespoons, or 2 Dulcolax tablets. Call if you continue to have problems. If you had been taking narcotics for pain, before, during or after your surgery, you may be constipated. Take a laxative if necessary.  Medication:  You should resume your pre-surgery medications unless told not to. In addition you may be given an antibiotic to prevent or treat infection. Antibiotics are not always necessary. All medication should be taken as prescribed until the bottles are finished unless you are having an unusual reaction to one of the drugs.  Problems you should report to [place]:  a. Fever greater than 101F. b. Heavy bleeding, or clots (see notes above about blood in urine). c. Inability to urinate. d. Drug reactions (hives, rash, nausea, vomiting, diarrhea). e. Severe burning or pain with urination that is not improving.

## 2023-07-19 NOTE — Consult Note (Signed)
 Urology Consult Note   Requesting Attending Physician:  Mozell Arias, MD Service Providing Consult: Urology  Consulting Attending: Dr. Valeta Gaudier   Reason for Consult: Right ureteral stone, intractable pain  HPI: Cassie  N English is seen in consultation for reasons noted above at the request of Mozell Arias, MD. Patient is a 43 year old female with PMH significant for obstructing renal stones.  She last underwent lithotripsy in 2018.  She has recently seen her PCP last Wednesday where she was provided with hydrocodone  and Flomax  without improvement, ultimately presenting to med [facility] emergency department on 07/18/2023.  Her pain was adequately temporized for discharge home.  She began vomiting again overnight and pain has returned, prompting a trip to [facility] emergency department.  On my arrival patient was alert, oriented, no distress.  She was accompanied by her daughter.  We reviewed the case and plan.  She has had stones before and was familiar with the stent procedure.  She is amenable to staged treatment.  She has been n.p.o. since 10 AM.    ------------------  Assessment:  43 y.o. female with right proximal ureteral stone with mild hydronephrosis   Recommendations: # Right ureteral stone # Intractable pain # Nausea/vomiting  To the OR with Dr. Valeta Gaudier for right ureteral stent placement.  Will need definitive stone management on an outpatient basis in a few weeks. Urinalysis unremarkable Remain n.p.o.  Case and plan discussed with Dr. Valeta Gaudier  Past Medical History: Past Medical History:  Diagnosis Date   ADD (attention deficit disorder)    Anemia    GAD (generalized anxiety disorder)    GERD (gastroesophageal reflux disease)    Kidney stones    Migraine     Past Surgical History:  Past Surgical History:  Procedure Laterality Date   CERVICAL CERCLAGE     LITHOTRIPSY Bilateral    ORIF FIBULA FRACTURE Right 12/07/2021   Procedure: OPEN REDUCTION  INTERNAL FIXATION (ORIF) FIBULA FRACTURE;  Surgeon: Osa Blase, MD;  Location: [facility] ORS;  Service: Orthopedics;  Laterality: Right;   WISDOM TOOTH EXTRACTION      Medication: No current facility-administered medications for this encounter.   Current Outpatient Medications  Medication Sig Dispense Refill   acetaminophen  (TYLENOL ) 500 MG tablet Take 1,000 mg by mouth every 6 (six) hours as needed for moderate pain.     amphetamine -dextroamphetamine  (ADDERALL) 10 MG tablet Take 1 tablet (10 mg total) by mouth daily with breakfast. 30 tablet 0   Cholecalciferol  1.25 MG (50000 UT) TABS Take 1 tablet by mouth once a week. 8 tablet 0   FLUoxetine  (PROZAC ) 20 MG capsule Take 1 capsule daily, 2 capsules during menses 105 capsule 3   hydrOXYzine  (VISTARIL ) 25 MG capsule TAKE ONE CAPSULE BY MOUTH EVERY 8 HOURS AS NEEDED 30 capsule 0   Iron , Ferrous Sulfate , 325 (65 Fe) MG TABS Take 325 mg by mouth every other day.     methylphenidate  (RITALIN  LA) 10 MG 24 hr capsule Take 1 capsule (10 mg total) by mouth daily. 30 capsule 0   naproxen (NAPROSYN) 500 MG tablet Take 1 tablet (500 mg total) by mouth 2 (two) times daily. 30 tablet 0   norethindrone  (ORTHO MICRONOR ) 0.35 MG tablet Take one od for continuous usage, do not use placebos just start new pack 112 tablet 3   ondansetron  (ZOFRAN -ODT) 4 MG disintegrating tablet Take 1 tablet (4 mg total) by mouth every 8 (eight) hours as needed for nausea or vomiting. 10 tablet 0   oxyCODONE-acetaminophen  (  PERCOCET/ROXICET) 5-325 MG tablet Take 1 tablet by mouth every 6 (six) hours as needed for severe pain (pain score 7-10). 15 tablet 0   rizatriptan  (MAXALT ) 10 MG tablet Take 1 tablet (10 mg total) by mouth as needed for migraine. May repeat in 2 hours if needed 10 tablet 2   tamsulosin  (FLOMAX ) 0.4 MG CAPS capsule Take 1 capsule (0.4 mg total) by mouth daily. 30 capsule 3   topiramate  (TOPAMAX ) 100 MG tablet TAKE ONE TABLET BY MOUTH TWICE A DAY 180 tablet 0     Allergies: Allergies  Allergen Reactions   Vyvanse [Lisdexamfetamine]    Codeine Itching   Nubain [Nalbuphine Hcl] Rash   Phenergan [Promethazine Hcl] Rash    IV clicking of tongue    Social History: Social History   Tobacco Use   Smoking status: Never   Smokeless tobacco: Never  Vaping Use   Vaping status: Never Used  Substance Use Topics   Alcohol use: No    Alcohol/week: 0.0 standard drinks of alcohol   Drug use: No    Comment: CBD on occasion    Family History Family History  Problem Relation Age of Onset   Diabetes Father    Aneurysm Father    Dementia Maternal Grandfather     Review of Systems  Genitourinary:  Positive for flank pain. Negative for dysuria, frequency, hematuria and urgency.     Objective   Vital signs in last 24 hours: BP (!) 134/94   Pulse 100   Temp 98.3 F (36.8 C) (Oral)   Resp 17   Ht 5\' 2"  (1.575 m)   Wt 73.5 kg   LMP 06/15/2023 (Exact Date)   SpO2 99%   BMI 29.63 kg/m   Physical Exam General: A&O, resting, appropriate HEENT: [place]/AT Pulmonary: Normal work of breathing Cardiovascular: no cyanosis Abdomen: Soft, NTTP, nondistended Neuro: Appropriate, no focal neurological deficits  Most Recent Labs: Lab Results  Component Value Date   WBC 7.0 07/18/2023   HGB 13.1 07/18/2023   HCT 40.6 07/18/2023   PLT 226 07/18/2023    Lab Results  Component Value Date   NA 139 07/18/2023   K 3.5 07/18/2023   CL 105 07/18/2023   CO2 22 07/18/2023   BUN 13 07/18/2023   CREATININE 0.78 07/18/2023   CALCIUM 9.5 07/18/2023    No results found for: "INR", "APTT"   Urine Culture: @[id number] (laburin,org,[id number])@   IMAGING: CT Renal Stone Study Result Date: 07/18/2023 CLINICAL DATA:  Right-sided flank pain and hematuria EXAM: CT ABDOMEN AND PELVIS WITHOUT CONTRAST TECHNIQUE: Multidetector CT imaging of the abdomen and pelvis was performed following the standard protocol without IV contrast. RADIATION DOSE REDUCTION:  This exam was performed according to the departmental dose-optimization program which includes automated exposure control, adjustment of the mA and/or kV according to patient size and/or use of iterative reconstruction technique. COMPARISON:  07/18/2017 FINDINGS: Lower chest: No acute abnormality. Hepatobiliary: No focal liver abnormality is seen. No gallstones, gallbladder wall thickening, or biliary dilatation. Pancreas: Unremarkable. No pancreatic ductal dilatation or surrounding inflammatory changes. Spleen: Normal in size without focal abnormality. Adrenals/Urinary Tract: Adrenal glands are within normal limits. Left kidney demonstrates scattered nonobstructing stones. The largest of these lies in the lower pole measuring 9 mm in dimension. No hydronephrosis on the left is seen. Mild fullness of the right renal collecting system is noted secondary to a 6 mm stone at the right UVJ. Scattered small nonobstructing stones are noted on the right. The distal right ureter is  within normal limits. The bladder is partially distended. Stomach/Bowel: Scattered fecal material is noted throughout the colon without obstructive change. The appendix is not well visualized. No inflammatory changes to suggest appendicitis are noted. Small bowel and stomach are within normal limits. Vascular/Lymphatic: No significant vascular findings are present. No enlarged abdominal or pelvic lymph nodes. Reproductive: Uterus and bilateral adnexa are unremarkable. Other: No abdominal wall hernia or abnormality. No abdominopelvic ascites. Musculoskeletal: No acute or significant osseous findings. IMPRESSION: 6 mm right UPJ stone with mild obstructive change. Bilateral nonobstructing renal calculi as described. Electronically Signed   By: Violeta Grey M.D.   On: 07/18/2023 23:29    ------  Alla Ar, NP Pager: [phone]   Please contact the urology consult pager with any further questions/concerns.

## 2023-07-19 NOTE — Anesthesia Preprocedure Evaluation (Signed)
 Anesthesia Evaluation  Patient identified by MRN, date of birth, ID band Patient awake    Reviewed: Allergy & Precautions, H&P , NPO status , Patient's Chart, lab work & pertinent test results  Airway Mallampati: II   Neck ROM: full    Dental   Pulmonary neg pulmonary ROS   breath sounds clear to auscultation       Cardiovascular negative cardio ROS  Rhythm:regular Rate:Normal     Neuro/Psych  Headaches PSYCHIATRIC DISORDERS Anxiety Depression       GI/Hepatic ,GERD  ,,  Endo/Other    Renal/GU Renal diseasestones     Musculoskeletal   Abdominal   Peds  Hematology  (+) Blood dyscrasia, anemia   Anesthesia Other Findings   Reproductive/Obstetrics                             Anesthesia Physical Anesthesia Plan  ASA: 2  Anesthesia Plan: General   Post-op Pain Management:    Induction: Intravenous  PONV Risk Score and Plan: 3 and Ondansetron , Dexamethasone , Midazolam  and Treatment may vary due to age or medical condition  Airway Management Planned: LMA  Additional Equipment:   Intra-op Plan:   Post-operative Plan: Extubation in OR  Informed Consent: I have reviewed the patients History and Physical, chart, labs and discussed the procedure including the risks, benefits and alternatives for the proposed anesthesia with the patient or authorized representative who has indicated his/her understanding and acceptance.     Dental advisory given  Plan Discussed with: CRNA, Anesthesiologist and Surgeon  Anesthesia Plan Comments:        Anesthesia Quick Evaluation

## 2023-07-19 NOTE — ED Triage Notes (Signed)
 Patient has had right sided flank pain since last Wednesday. Diagnosed with kidney stones last night. Was told to come back if pain got worse and she said it did. Has been vomiting.

## 2023-07-19 NOTE — Anesthesia Procedure Notes (Signed)
 Procedure Name: Intubation Date/Time: 07/19/2023 4:50 PM  Performed by: Ulanda Gambles, CRNAPre-anesthesia Checklist: Patient identified, Emergency Drugs available, Suction available and Patient being monitored Patient Re-evaluated:Patient Re-evaluated prior to induction Oxygen Delivery Method: Circle system utilized Preoxygenation: Pre-oxygenation with 100% oxygen Induction Type: IV induction and Rapid sequence Laryngoscope Size: Mac and 4 Grade View: Grade I Tube type: Oral Tube size: 7.0 mm Number of attempts: 1 Airway Equipment and Method: Stylet Placement Confirmation: ETT inserted through vocal cords under direct vision, positive ETCO2 and breath sounds checked- equal and bilateral Secured at: 22 cm Tube secured with: Tape Dental Injury: Teeth and Oropharynx as per pre-operative assessment

## 2023-07-19 NOTE — ED Provider Notes (Signed)
 [place] EMERGENCY DEPARTMENT AT [facility] Provider Note   CSN: [id number] Arrival date & time: 07/19/23  1437     History  Chief Complaint  Patient presents with   Flank Pain    Cassie  YAMILET English is a 43 y.o. female.   Flank Pain  Patient with right flank pain.  Began around a week ago.  Seen yesterday in the ER.  Has been on hydrocodone  and Flomax  by PCP for presumed stone.  Yesterday seen in the ER and had 6 mm proximal UPJ stone.  Mild hydronephrosis.  Continued pain.  Unrelieved now with oxycodone has been vomiting.  Previous lithotripsy around 7 years ago.  Last ate around 1030 this morning.    Past Medical History:  Diagnosis Date   ADD (attention deficit disorder)    Anemia    GAD (generalized anxiety disorder)    GERD (gastroesophageal reflux disease)    Kidney stones    Migraine     Home Medications Prior to Admission medications   Medication Sig Start Date End Date Taking? Authorizing Provider  acetaminophen  (TYLENOL ) 500 MG tablet Take 1,000 mg by mouth every 6 (six) hours as needed for moderate pain.    [provider]  amphetamine -dextroamphetamine  (ADDERALL) 10 MG tablet Take 1 tablet (10 mg total) by mouth daily with breakfast. 03/13/23 04/12/23  Felicita Horns, FNP  Cholecalciferol  1.25 MG (50000 UT) TABS Take 1 tablet by mouth once a week. 01/03/23   Felicita Horns, FNP  FLUoxetine  (PROZAC ) 20 MG capsule Take 1 capsule daily, 2 capsules during menses 01/03/23   Dugal, Tabitha, FNP  hydrOXYzine  (VISTARIL ) 25 MG capsule TAKE ONE CAPSULE BY MOUTH EVERY 8 HOURS AS NEEDED 05/30/23   Felicita Horns, FNP  Iron , Ferrous Sulfate , 325 (65 Fe) MG TABS Take 325 mg by mouth every other day. 03/13/23   Felicita Horns, FNP  methylphenidate  (RITALIN  LA) 10 MG 24 hr capsule Take 1 capsule (10 mg total) by mouth daily. 05/04/23   Dugal, Tabitha, FNP  naproxen (NAPROSYN) 500 MG tablet Take 1 tablet (500 mg total) by mouth 2 (two) times daily. 07/19/23    Charmayne Cooper, MD  norethindrone  (ORTHO MICRONOR ) 0.35 MG tablet Take one od for continuous usage, do not use placebos just start new pack 01/03/23   Felicita Horns, FNP  ondansetron  (ZOFRAN -ODT) 4 MG disintegrating tablet Take 1 tablet (4 mg total) by mouth every 8 (eight) hours as needed for nausea or vomiting. 01/03/23   Felicita Horns, FNP  oxyCODONE-acetaminophen  (PERCOCET/ROXICET) 5-325 MG tablet Take 1 tablet by mouth every 6 (six) hours as needed for severe pain (pain score 7-10). 07/19/23   Charmayne Cooper, MD  rizatriptan  (MAXALT ) 10 MG tablet Take 1 tablet (10 mg total) by mouth as needed for migraine. May repeat in 2 hours if needed 01/03/23   Felicita Horns, FNP  tamsulosin  (FLOMAX ) 0.4 MG CAPS capsule Take 1 capsule (0.4 mg total) by mouth daily. 07/13/23   Bedsole, Amy E, MD  topiramate  (TOPAMAX ) 100 MG tablet TAKE ONE TABLET BY MOUTH TWICE A DAY 07/10/23   Felicita Horns, FNP      Allergies    Vyvanse [lisdexamfetamine], Codeine, Nubain [nalbuphine hcl], and Phenergan [promethazine hcl]    Review of Systems   Review of Systems  Genitourinary:  Positive for flank pain.    Physical Exam Updated Vital Signs BP (!) 134/94   Pulse 100   Temp 98.3 F (36.8 C) (Oral)   Resp 17   Ht  5\' 2"  (1.575 m)   Wt 73.5 kg   LMP 06/15/2023 (Exact Date)   SpO2 99%   BMI 29.63 kg/m  Physical Exam Vitals and nursing note reviewed.  Chest:     Chest wall: No tenderness.  Abdominal:     Tenderness: There is no abdominal tenderness.  Genitourinary:    Comments: No CVA tenderness. Musculoskeletal:        General: No tenderness.     Cervical back: Neck supple.  Skin:    Capillary Refill: Capillary refill takes less than 2 seconds.  Neurological:     Mental Status: She is alert and oriented to person, place, and time.     ED Results / Procedures / Treatments   Labs (all labs ordered are listed, but only abnormal results are displayed) Labs Reviewed - No data to  display  EKG None  Radiology CT Renal Stone Study Result Date: 07/18/2023 CLINICAL DATA:  Right-sided flank pain and hematuria EXAM: CT ABDOMEN AND PELVIS WITHOUT CONTRAST TECHNIQUE: Multidetector CT imaging of the abdomen and pelvis was performed following the standard protocol without IV contrast. RADIATION DOSE REDUCTION: This exam was performed according to the departmental dose-optimization program which includes automated exposure control, adjustment of the mA and/or kV according to patient size and/or use of iterative reconstruction technique. COMPARISON:  07/18/2017 FINDINGS: Lower chest: No acute abnormality. Hepatobiliary: No focal liver abnormality is seen. No gallstones, gallbladder wall thickening, or biliary dilatation. Pancreas: Unremarkable. No pancreatic ductal dilatation or surrounding inflammatory changes. Spleen: Normal in size without focal abnormality. Adrenals/Urinary Tract: Adrenal glands are within normal limits. Left kidney demonstrates scattered nonobstructing stones. The largest of these lies in the lower pole measuring 9 mm in dimension. No hydronephrosis on the left is seen. Mild fullness of the right renal collecting system is noted secondary to a 6 mm stone at the right UVJ. Scattered small nonobstructing stones are noted on the right. The distal right ureter is within normal limits. The bladder is partially distended. Stomach/Bowel: Scattered fecal material is noted throughout the colon without obstructive change. The appendix is not well visualized. No inflammatory changes to suggest appendicitis are noted. Small bowel and stomach are within normal limits. Vascular/Lymphatic: No significant vascular findings are present. No enlarged abdominal or pelvic lymph nodes. Reproductive: Uterus and bilateral adnexa are unremarkable. Other: No abdominal wall hernia or abnormality. No abdominopelvic ascites. Musculoskeletal: No acute or significant osseous findings. IMPRESSION: 6 mm  right UPJ stone with mild obstructive change. Bilateral nonobstructing renal calculi as described. Electronically Signed   By: Violeta Grey M.D.   On: 07/18/2023 23:29    Procedures Procedures    Medications Ordered in ED Medications  HYDROmorphone  (DILAUDID ) injection 0.5 mg (0.5 mg Intravenous Given 07/19/23 1503)  ondansetron  (ZOFRAN ) injection 4 mg (4 mg Intravenous Given 07/19/23 1503)    ED Course/ Medical Decision Making/ A&P                                 Medical Decision Making Risk Prescription drug management. Decision regarding hospitalization.   Patient with right flank pain.  Recent CT scan showing stone.  Pain uncontrolled at home although appears well now.  Will give pain medicine and antiemetic.  No fevers.  Reviewed CT scan from yesterday.  Discussed with Donelda Fujita from urology, who will consult on patient.  Urology has seen patient.  Will stent today.  Final Clinical Impression(s) / ED Diagnoses Final diagnoses:  Kidney stone    Rx / DC Orders ED Discharge Orders     None         Mozell Arias, MD 07/19/23 [phone]

## 2023-07-19 NOTE — Op Note (Signed)
 Preoperative diagnosis:  Right ureteral calculus   Postoperative diagnosis:  Right ureteral calculus   Procedure:  Cystoscopy right ureteral stent placement - 6Fr x 24 cm no string right retrograde pyelography with interpretation   Surgeon: Perley Bradley, MD  Anesthesia: General  Complications: None  Intraoperative findings:  Normal urethra Bilateral orthotopic UOS right retrograde pyelography demonstrated a filling defect within the right ureter consistent with the patient's known calculus and mild proximal right hydronephrosis  EBL: Minimal  Specimens: None  Indication: Kathleen  N Cedar is a 43 y.o. patient with 6mm right UPJ calculus and intractable pain. After reviewing the management options for treatment, he elected to proceed with the above surgical procedure(s). We have discussed the potential benefits and risks of the procedure, side effects of the proposed treatment, the likelihood of the patient achieving the goals of the procedure, and any potential problems that might occur during the procedure or recuperation. Informed consent has been obtained.  Description of procedure:  The patient was taken to the operating room and general anesthesia was induced.  The patient was placed in the dorsal lithotomy position, prepped and draped in the usual sterile fashion, and preoperative antibiotics were administered. A preoperative time-out was performed.   Cystourethroscopy was performed.  The patient's urethra was examined and was normal. The bladder was then systematically examined in its entirety. There was no evidence for any bladder tumors, stones, or other mucosal pathology.    Attention then turned to the rightureteral orifice and a ureteral catheter was used to intubate the ureteral orifice.  Omnipaque contrast was injected through the ureteral catheter and a retrograde pyelogram was performed with findings as dictated above.  A 0.38 sensor guidewire was then advanced  up the right ureter into the renal pelvis under fluoroscopic guidance.  The wire was then backloaded through the cystoscope and a ureteral stent was advance over the wire using Seldinger technique.  The stent was positioned appropriately under fluoroscopic and cystoscopic guidance.  The wire was then removed with an adequate stent curl noted in the renal pelvis as well as in the bladder.  The bladder was then emptied and the procedure ended.  The patient appeared to tolerate the procedure well and without complications.  The patient was able to be awakened and transferred to the recovery unit in satisfactory condition.    Perley Bradley, M.D.

## 2023-07-19 NOTE — Transfer of Care (Signed)
 Immediate Anesthesia Transfer of Care Note  Patient: Cassie  N English  Procedure(s) Performed: CYSTOSCOPY, WITH STENT INSERTION (Right)  Patient Location: PACU  Anesthesia Type:General  Level of Consciousness: awake, alert , and oriented  Airway & Oxygen Therapy: Patient Spontanous Breathing and Patient connected to face mask oxygen  Post-op Assessment: Report given to RN and Post -op Vital signs reviewed and stable  Post vital signs: Reviewed and stable  Last Vitals:  Vitals Value Taken Time  BP 103/62   Temp    Pulse 88 07/19/23 1723  Resp 14 07/19/23 1723  SpO2 91 % 07/19/23 1723  Vitals shown include unfiled device data.  Last Pain:  Vitals:   07/19/23 1605  TempSrc: Oral  PainSc:          Complications: No notable events documented.

## 2023-07-20 ENCOUNTER — Ambulatory Visit: Admitting: Urology

## 2023-07-20 ENCOUNTER — Encounter (HOSPITAL_COMMUNITY): Payer: Self-pay | Admitting: Urology

## 2023-07-20 DIAGNOSIS — N201 Calculus of ureter: Secondary | ICD-10-CM

## 2023-07-21 NOTE — Anesthesia Postprocedure Evaluation (Signed)
 Anesthesia Post Note  Patient: Cassie English  N Deroo  Procedure(s) Performed: CYSTOSCOPY, WITH STENT INSERTION (Right)     Patient location during evaluation: PACU Anesthesia Type: General Level of consciousness: awake and alert Pain management: pain level controlled Vital Signs Assessment: post-procedure vital signs reviewed and stable Respiratory status: spontaneous breathing, nonlabored ventilation, respiratory function stable and patient connected to nasal cannula oxygen Cardiovascular status: blood pressure returned to baseline and stable Postop Assessment: no apparent nausea or vomiting Anesthetic complications: no   No notable events documented.  Last Vitals:  Vitals:   07/19/23 1815 07/19/23 1827  BP: (!) 110/93 (!) 116/96  Pulse: 86 96  Resp: 13   Temp: (!) 36.4 C   SpO2: 99% 100%    Last Pain:  Vitals:   07/19/23 1827  TempSrc:   PainSc: 0-No pain                 Benjamim Harnish S

## 2023-07-25 ENCOUNTER — Telehealth: Payer: Self-pay | Admitting: Family

## 2023-07-25 DIAGNOSIS — N200 Calculus of kidney: Secondary | ICD-10-CM

## 2023-07-25 NOTE — Telephone Encounter (Unsigned)
 Copied from CRM [id number]. Topic: Referral - Status >> Jul 25, 2023  3:42 PM Taleah C wrote: Reason for CRM: patient called and stated that she saw Dr. Cherlyn Cornet for Kidney Stones and was written a referral to Urology. However, her insurance is not covered there but is accepted at [facility] in [place] tele: [phone] fax: [phone]. She mentioned that the clinic suggested for Dr. Cherlyn Cornet to list the referral as urgent so they can see the patient sooner. Please advise.

## 2023-07-26 ENCOUNTER — Telehealth: Payer: Self-pay | Admitting: Urology

## 2023-07-26 NOTE — Telephone Encounter (Signed)
 [facility] called to schedule pt for a follow up and then figured out it needed to be for a operation. Pt has a stone that needs to be removed per the staff at [facility] u[facility]. They are going to send over the notes and recommendations since they do not take pt's insurance.

## 2023-07-26 NOTE — Addendum Note (Signed)
 Addended byHerby Lolling E on: 07/26/2023 10:00 AM   Modules accepted: Orders

## 2023-07-30 NOTE — H&P (View-Only) (Signed)
 Chief Complaint:  Chief Complaint  Patient presents with   Nephrolithiasis    History of Present Illness:  Cassie English is a 43 y.o. female here for continued attention to a Rt ureteral stone. She had a Rt J2 stent placed by Dr Valeta Gaudier on 5.7.2025.  She has been having right sided pain since April 28.  Since her procedure on 7 May she has had constant stent pain.  She has been unable to work because of this.  She is on hyoscyamine .  She has had 1 prior stone several years ago, treated by Dr. Parke Boll.   Past Medical History:  Past Medical History:  Diagnosis Date   ADD (attention deficit disorder)    Anemia    GAD (generalized anxiety disorder)    GERD (gastroesophageal reflux disease)    Kidney stones    Migraine     Past Surgical History:  Past Surgical History:  Procedure Laterality Date   CERVICAL CERCLAGE     CYSTOSCOPY WITH STENT PLACEMENT Right 07/19/2023   Procedure: CYSTOSCOPY, WITH STENT INSERTION;  Surgeon: Roxane Copp, MD;  Location: [facility];  Service: Urology;  Laterality: Right;   LITHOTRIPSY Bilateral    ORIF FIBULA FRACTURE Right 12/07/2021   Procedure: OPEN REDUCTION INTERNAL FIXATION (ORIF) FIBULA FRACTURE;  Surgeon: Osa Blase, MD;  Location: [facility];  Service: Orthopedics;  Laterality: Right;   WISDOM TOOTH EXTRACTION      Allergies:  Allergies  Allergen Reactions   Oxycodone -Acetaminophen  Hives and Itching   Vyvanse [Lisdexamfetamine]    Codeine Itching   Nubain [Nalbuphine Hcl] Rash   Phenergan [Promethazine Hcl] Rash    IV clicking of tongue    Family History:  Family History  Problem Relation Age of Onset   Diabetes Father    Aneurysm Father    Dementia Maternal Grandfather     Social History:  Social History   Tobacco Use   Smoking status: Never   Smokeless tobacco: Never  Vaping Use   Vaping status: Never Used  Substance Use Topics   Alcohol use: No    Alcohol/week: 0.0 standard drinks of alcohol   Drug use: No     Comment: CBD on occasion    Review of symptoms:  Constitutional:  Negative for unexplained weight loss, night sweats, fever, chills ENT:  Negative for nose bleeds, sinus pain, painful swallowing CV:  Negative for chest pain, shortness of breath, exercise intolerance, palpitations, loss of consciousness Resp:  Negative for cough, wheezing, shortness of breath GI:  Negative for nausea, vomiting, diarrhea, bloody stools GU:  Positives noted in HPI; otherwise negative for gross hematuria, dysuria, urinary incontinence Neuro:  Negative for seizures, poor balance, limb weakness, slurred speech Psych:  Negative for lack of energy, depression, anxiety Endocrine:  Negative for polydipsia, polyuria, symptoms of hypoglycemia (dizziness, hunger, sweating) Hematologic:  Negative for anemia, purpura, petechia, prolonged or excessive bleeding, use of anticoagulants  Allergic:  Negative for difficulty breathing or choking as a result of exposure to anything; no shellfish allergy; no allergic response (rash/itch) to materials, foods  Physical exam: BP 111/78   Pulse 93   Ht 5\' 2"  (1.575 m)   Wt 162 lb (73.5 kg)   LMP 06/15/2023 (Exact Date) Comment: Urine Pregnancy 07/18/23 negative  BMI 29.63 kg/m  GENERAL APPEARANCE:  Well appearing, well developed, well nourished, NAD HEENT: Atraumatic, Normocephalic. NECK: Normal appearance LUNGS: Normal inspiratory and expiratory excursion HEART: Regular Rate EXTREMITIES: Moves all extremities well.  Without clubbing, cyanosis,  or edema. NEUROLOGIC:  Alert and oriented x 3, normal gait, CN II-XII grossly intact.  MENTAL STATUS:  Appropriate. SKIN:  Warm, dry and intact.    Results:   I have reviewed referring/prior physicians records  I have reviewed urinalysis  I reviewed prior imaging studies--CT scan reviewed.  Prior to stent placement she had a 6 mm right UPJ stone.  There is a 15.9 mm left lower pole stone.  Assessment: -Right upper ureteral  calculus, status post stenting.  She is having a significant amount of stent discomfort  -Large left lower pole calculus, currently asymptomatic   Plan: -I will have a KUB obtained today.  Hopefully, stone can be seen on this for eventual shockwave lithotripsy  -I let her know that she will eventually need management of her left lower pole stone burden as well as metabolic evaluation  -In addition to hyoscyamine , I gave her 2 weeks samples of Gemtesa

## 2023-07-30 NOTE — Progress Notes (Signed)
 Chief Complaint:  Chief Complaint  Patient presents with   Nephrolithiasis    History of Present Illness:  Cassie English is a 43 y.o. female here for continued attention to a Rt ureteral stone. She had a Rt J2 stent placed by Dr Valeta Gaudier on 5.7.2025.  She has been having right sided pain since April 28.  Since her procedure on 7 May she has had constant stent pain.  She has been unable to work because of this.  She is on hyoscyamine .  She has had 1 prior stone several years ago, treated by Dr. Parke Boll.   Past Medical History:  Past Medical History:  Diagnosis Date   ADD (attention deficit disorder)    Anemia    GAD (generalized anxiety disorder)    GERD (gastroesophageal reflux disease)    Kidney stones    Migraine     Past Surgical History:  Past Surgical History:  Procedure Laterality Date   CERVICAL CERCLAGE     CYSTOSCOPY WITH STENT PLACEMENT Right 07/19/2023   Procedure: CYSTOSCOPY, WITH STENT INSERTION;  Surgeon: Roxane Copp, MD;  Location: [facility];  Service: Urology;  Laterality: Right;   LITHOTRIPSY Bilateral    ORIF FIBULA FRACTURE Right 12/07/2021   Procedure: OPEN REDUCTION INTERNAL FIXATION (ORIF) FIBULA FRACTURE;  Surgeon: Osa Blase, MD;  Location: [facility];  Service: Orthopedics;  Laterality: Right;   WISDOM TOOTH EXTRACTION      Allergies:  Allergies  Allergen Reactions   Oxycodone -Acetaminophen  Hives and Itching   Vyvanse [Lisdexamfetamine]    Codeine Itching   Nubain [Nalbuphine Hcl] Rash   Phenergan [Promethazine Hcl] Rash    IV clicking of tongue    Family History:  Family History  Problem Relation Age of Onset   Diabetes Father    Aneurysm Father    Dementia Maternal Grandfather     Social History:  Social History   Tobacco Use   Smoking status: Never   Smokeless tobacco: Never  Vaping Use   Vaping status: Never Used  Substance Use Topics   Alcohol use: No    Alcohol/week: 0.0 standard drinks of alcohol   Drug use: No     Comment: CBD on occasion    Review of symptoms:  Constitutional:  Negative for unexplained weight loss, night sweats, fever, chills ENT:  Negative for nose bleeds, sinus pain, painful swallowing CV:  Negative for chest pain, shortness of breath, exercise intolerance, palpitations, loss of consciousness Resp:  Negative for cough, wheezing, shortness of breath GI:  Negative for nausea, vomiting, diarrhea, bloody stools GU:  Positives noted in HPI; otherwise negative for gross hematuria, dysuria, urinary incontinence Neuro:  Negative for seizures, poor balance, limb weakness, slurred speech Psych:  Negative for lack of energy, depression, anxiety Endocrine:  Negative for polydipsia, polyuria, symptoms of hypoglycemia (dizziness, hunger, sweating) Hematologic:  Negative for anemia, purpura, petechia, prolonged or excessive bleeding, use of anticoagulants  Allergic:  Negative for difficulty breathing or choking as a result of exposure to anything; no shellfish allergy; no allergic response (rash/itch) to materials, foods  Physical exam: BP 111/78   Pulse 93   Ht 5\' 2"  (1.575 m)   Wt 162 lb (73.5 kg)   LMP 06/15/2023 (Exact Date) Comment: Urine Pregnancy 07/18/23 negative  BMI 29.63 kg/m  GENERAL APPEARANCE:  Well appearing, well developed, well nourished, NAD HEENT: Atraumatic, Normocephalic. NECK: Normal appearance LUNGS: Normal inspiratory and expiratory excursion HEART: Regular Rate EXTREMITIES: Moves all extremities well.  Without clubbing, cyanosis,  or edema. NEUROLOGIC:  Alert and oriented x 3, normal gait, CN II-XII grossly intact.  MENTAL STATUS:  Appropriate. SKIN:  Warm, dry and intact.    Results:   I have reviewed referring/prior physicians records  I have reviewed urinalysis  I reviewed prior imaging studies--CT scan reviewed.  Prior to stent placement she had a 6 mm right UPJ stone.  There is a 15.9 mm left lower pole stone.  Assessment: -Right upper ureteral  calculus, status post stenting.  She is having a significant amount of stent discomfort  -Large left lower pole calculus, currently asymptomatic   Plan: -I will have a KUB obtained today.  Hopefully, stone can be seen on this for eventual shockwave lithotripsy  -I let her know that she will eventually need management of her left lower pole stone burden as well as metabolic evaluation  -In addition to hyoscyamine , I gave her 2 weeks samples of Gemtesa

## 2023-07-31 ENCOUNTER — Ambulatory Visit (HOSPITAL_BASED_OUTPATIENT_CLINIC_OR_DEPARTMENT_OTHER)
Admission: RE | Admit: 2023-07-31 | Discharge: 2023-07-31 | Disposition: A | Discharge status: Home / Self Care | Source: Ambulatory Visit | Attending: Urology | Admitting: Urology

## 2023-07-31 ENCOUNTER — Encounter: Payer: Self-pay | Admitting: Urology

## 2023-07-31 ENCOUNTER — Ambulatory Visit (INDEPENDENT_AMBULATORY_CARE_PROVIDER_SITE_OTHER): Admitting: Urology

## 2023-07-31 VITALS — BP 111/78 | HR 93 | Ht 62.0 in | Wt 162.0 lb

## 2023-07-31 DIAGNOSIS — N201 Calculus of ureter: Secondary | ICD-10-CM | POA: Insufficient documentation

## 2023-07-31 DIAGNOSIS — N200 Calculus of kidney: Secondary | ICD-10-CM

## 2023-07-31 LAB — URINALYSIS, ROUTINE W REFLEX MICROSCOPIC
Bilirubin, UA: NEGATIVE
Glucose, UA: NEGATIVE
Nitrite, UA: NEGATIVE
Specific Gravity, UA: 1.02 (ref 1.005–1.030)
Urobilinogen, Ur: 1 mg/dL (ref 0.2–1.0)
pH, UA: 6 (ref 5.0–7.5)

## 2023-07-31 LAB — MICROSCOPIC EXAMINATION: RBC, Urine: >30 /HPF — AB (ref 0–2)

## 2023-08-01 ENCOUNTER — Telehealth: Payer: Self-pay | Admitting: Urology

## 2023-08-01 ENCOUNTER — Telehealth: Payer: Self-pay

## 2023-08-01 NOTE — Telephone Encounter (Signed)
 Spoke with pt in reference to stone placement and ESWL with Dr. Valeta Gaudier. Pt voiced understanding.

## 2023-08-01 NOTE — Telephone Encounter (Signed)
 Patient was told by someone in our office to bring FMLA paperwork to fill out.  Paperwork was dropped off.

## 2023-08-01 NOTE — Telephone Encounter (Signed)
-----   Message from Malcolm Scrivener Dahlstedt sent at 07/31/2023  4:20 PM EDT ----- Please call patient-I can see the stone on a plain x-ray.  I am getting back with Dr. Valeta Gaudier in [place] to see if we can put her on for lithotripsy at [facility] on Friday.

## 2023-08-03 ENCOUNTER — Other Ambulatory Visit: Payer: Self-pay | Admitting: Urology

## 2023-08-03 ENCOUNTER — Ambulatory Visit: Payer: Self-pay | Admitting: Urology

## 2023-08-03 ENCOUNTER — Telehealth: Payer: Self-pay | Admitting: Urology

## 2023-08-03 DIAGNOSIS — N201 Calculus of ureter: Secondary | ICD-10-CM

## 2023-08-03 MED ORDER — HYOSCYAMINE SULFATE 0.125 MG PO TBDP: 0.1250 mg | ORAL_TABLET | Freq: Four times a day (QID) | ORAL | 0 refills | Status: DC | PRN

## 2023-08-03 MED ORDER — HYDROCODONE-ACETAMINOPHEN 5-325 MG PO TABS: 1.0000 | ORAL_TABLET | Freq: Four times a day (QID) | ORAL | 0 refills | Status: DC | PRN

## 2023-08-03 MED ORDER — PHENAZOPYRIDINE HCL 200 MG PO TABS: 200.0000 mg | ORAL_TABLET | Freq: Three times a day (TID) | ORAL | 0 refills | Status: DC | PRN

## 2023-08-03 MED ORDER — NITROFURANTOIN MONOHYD MACRO 100 MG PO CAPS
100.0000 mg | ORAL_CAPSULE | Freq: Every day | ORAL | 0 refills | Status: DC
Start: 2023-08-03 — End: 2023-09-27

## 2023-08-03 NOTE — Telephone Encounter (Signed)
 Pt called to inquire about being put on Or schedule with Dr Willye Harvey please advise. Pt will also be out of paid meds tomorrow and wanting to know if he can call in a refill. Please advise.

## 2023-08-03 NOTE — Telephone Encounter (Signed)
 I have not received any messages about adding her for surgery. Am I supposed to? Also, she is requesting more pain pills. Please advise.

## 2023-08-04 NOTE — Telephone Encounter (Signed)
 Spoke with pt in reference to meds, surgery, and FMLA papers. Made pt aware of meds at pharmacy. Pt stated that the hydrocodone  makes her itch as well, but she has some vistaril  that she is going to try for the itching. Pt states she was given tramadol  for pain and its not working well anymore. Pt will call back if vistaril  does not help with itching from hydrocodone . Pt made aware of 6/2 surgery date. Also made pt aware will have FMLA forms completed today. Pt voiced understanding.

## 2023-08-09 ENCOUNTER — Other Ambulatory Visit: Payer: Self-pay | Admitting: Urology

## 2023-08-09 DIAGNOSIS — N201 Calculus of ureter: Secondary | ICD-10-CM

## 2023-08-10 ENCOUNTER — Encounter (HOSPITAL_COMMUNITY): Payer: Self-pay

## 2023-08-10 NOTE — Patient Instructions (Signed)
 SURGICAL WAITING ROOM VISITATION  Patients having surgery or a procedure may have no more than 2 support people in the waiting area - these visitors may rotate.    Children under the age of 63 must have an adult with them who is not the patient.   Visitors with respiratory illnesses are discouraged from visiting and should remain at home.  If the patient needs to stay at the hospital during part of their recovery, the visitor guidelines for inpatient rooms apply. Pre-op nurse will coordinate an appropriate time for 1 support person to accompany patient in pre-op.  This support person may not rotate.    Please refer to the [facility] website for the visitor guidelines for Inpatients (after your surgery is over and you are in a regular room).       Your procedure is scheduled on: 08-14-23   Report to [facility] Main Entrance    Report to admitting at      1:30 PM   Call this number if you have problems the morning of surgery [phone]   Do not eat food :After Midnight.   After Midnight you may have the following liquids until _0945_____ AM/ DAY OF SURGERY   then nothing by mouth  Water                                     Sports drinks like Gatorade (NO RED)                             If you have questions, please contact your surgeon's office.   FOLLOW ANY ADDITIONAL PRE OP INSTRUCTIONS YOU RECEIVED FROM YOUR SURGEON'S OFFICE!!!     Oral Hygiene is also important to reduce your risk of infection.                                    Remember - BRUSH YOUR TEETH THE MORNING OF SURGERY WITH YOUR REGULAR TOOTHPASTE  DENTURES WILL BE REMOVED PRIOR TO SURGERY PLEASE DO NOT APPLY "Poly grip" OR ADHESIVES!!!   Do NOT smoke after Midnight   Stop all vitamins and herbal supplements 7 days before surgery.   Take these medicines the morning of surgery with A SIP OF WATER: Tamsulosin , Norethindrone , Nitrofurantoin, Hydrocodone  if needed                                   You may not have any metal on your body including hair pins, jewelry, and body piercing             Do not wear make-up, lotions, powders, perfumes/cologne, or deodorant  Do not wear nail polish including gel and S&S, artificial/acrylic nails, or any other type of covering on natural nails including finger and toenails. If you have artificial nails, gel coating, etc. that needs to be removed by a nail salon please have this removed prior to surgery or surgery may need to be canceled/ delayed if the surgeon/ anesthesia feels like they are unable to be safely monitored.   Do not shave  48 hours prior to surgery.          Do not bring valuables to the hospital. [facility] IS NOT  RESPONSIBLE   FOR VALUABLES.   Contacts, glasses, dentures or bridgework may not be worn into surgery.   Bring small overnight bag day of surgery.   DO NOT BRING YOUR HOME MEDICATIONS TO THE HOSPITAL. PHARMACY WILL DISPENSE MEDICATIONS LISTED ON YOUR MEDICATION LIST TO YOU DURING YOUR ADMISSION IN THE HOSPITAL!    Patients discharged on the day of surgery will not be allowed to drive home.  Someone NEEDS to stay with you for the first 24 hours after anesthesia.   Special Instructions: Bring a copy of your healthcare power of attorney and living will documents the day of surgery if you haven't scanned them before.              Please read over the following fact sheets you were given: IF YOU HAVE QUESTIONS ABOUT YOUR PRE-OP INSTRUCTIONS PLEASE CALL [phone]    If you test positive for Covid or have been in contact with anyone that has tested positive in the last 10 days please notify you surgeon.    [facility] - Preparing for Surgery Before surgery, you can play an important role.  Because skin is not sterile, your skin needs to be as free of germs as possible.  You can reduce the number of germs on your skin by washing with CHG (chlorahexidine gluconate) soap before surgery.  CHG is an  antiseptic cleaner which kills germs and bonds with the skin to continue killing germs even after washing. Please DO NOT use if you have an allergy to CHG or antibacterial soaps.  If your skin becomes reddened/irritated stop using the CHG and inform your nurse when you arrive at Short Stay. Do not shave (including legs and underarms) for at least 48 hours prior to the first CHG shower.  You may shave your face/neck. Please follow these instructions carefully:  1.  Shower with CHG Soap the night before surgery and the  morning of Surgery.  2.  If you choose to wash your hair, wash your hair first as usual with your  normal  shampoo.  3.  After you shampoo, rinse your hair and body thoroughly to remove the  shampoo.                           4.  Use CHG as you would any other liquid soap.  You can apply chg directly  to the skin and wash                       Gently with a scrungie or clean washcloth.  5.  Apply the CHG Soap to your body ONLY FROM THE NECK DOWN.   Do not use on face/ open                           Wound or open sores. Avoid contact with eyes, ears mouth and genitals (private parts).                       Wash face,  Genitals (private parts) with your normal soap.             6.  Wash thoroughly, paying special attention to the area where your surgery  will be performed.  7.  Thoroughly rinse your body with warm water from the neck down.  8.  DO NOT shower/wash with your normal soap after using and  rinsing off  the CHG Soap.                9.  Pat yourself dry with a clean towel.            10.  Wear clean pajamas.            11.  Place clean sheets on your bed the night of your first shower and do not  sleep with pets. Day of Surgery : Do not apply any lotions/deodorants the morning of surgery.  Please wear clean clothes to the hospital/surgery center.  FAILURE TO FOLLOW THESE INSTRUCTIONS MAY RESULT IN THE CANCELLATION OF YOUR SURGERY PATIENT  SIGNATURE_________________________________  NURSE SIGNATURE__________________________________  ________________________________________________________________________

## 2023-08-10 NOTE — Progress Notes (Signed)
 PCP - Felicita Horns , PA Cardiologist - no  PPM/ICD -  Device Orders -  Rep Notified -   Chest x-ray -  EKG -  Stress Test -  ECHO -  Cardiac Cath -  LABS-- cbc/diff CMP 07-18-23 epic  Sleep Study -  CPAP - n/a  Fasting Blood Sugar -  Checks Blood Sugar __n/a___ times a day  Blood Thinner Instructions:n/a Aspirin Instructions:n/a  ERAS Protcol -N/A PRE-SURGERY    COVID vaccine -yes  Activity--Able to climb a flight of stairs with no CP or SOB Anesthesia review:   Patient denies shortness of breath, fever, cough and chest pain at PAT appointment   All instructions explained to the patient, with a verbal understanding of the material. Patient agrees to go over the instructions while at home for a better understanding. Patient also instructed to self quarantine after being tested for COVID-19. The opportunity to ask questions was provided.

## 2023-08-11 ENCOUNTER — Other Ambulatory Visit: Payer: Self-pay

## 2023-08-11 ENCOUNTER — Encounter (HOSPITAL_COMMUNITY)
Admission: RE | Admit: 2023-08-11 | Discharge: 2023-08-11 | Disposition: A | Discharge status: Home / Self Care | Source: Ambulatory Visit | Attending: Urology | Admitting: Urology

## 2023-08-11 ENCOUNTER — Encounter (HOSPITAL_COMMUNITY): Payer: Self-pay

## 2023-08-11 VITALS — BP 113/73 | HR 93 | Temp 98.7°F | Resp 18 | Ht 62.0 in | Wt 160.0 lb

## 2023-08-11 DIAGNOSIS — Z01818 Encounter for other preprocedural examination: Secondary | ICD-10-CM | POA: Diagnosis present

## 2023-08-11 HISTORY — DX: Personal history of urinary calculi: Z87.442

## 2023-08-14 ENCOUNTER — Ambulatory Visit (HOSPITAL_COMMUNITY): Payer: PRIVATE HEALTH INSURANCE | Admitting: Physician Assistant

## 2023-08-14 ENCOUNTER — Ambulatory Visit (HOSPITAL_COMMUNITY)
Admission: RE | Admit: 2023-08-14 | Discharge: 2023-08-14 | Disposition: A | Discharge status: Home / Self Care | Payer: PRIVATE HEALTH INSURANCE | Attending: Urology | Admitting: Urology

## 2023-08-14 ENCOUNTER — Encounter (HOSPITAL_COMMUNITY): Payer: Self-pay | Admitting: Urology

## 2023-08-14 ENCOUNTER — Ambulatory Visit (HOSPITAL_COMMUNITY): Payer: PRIVATE HEALTH INSURANCE

## 2023-08-14 ENCOUNTER — Encounter (HOSPITAL_COMMUNITY)
Admission: RE | Disposition: A | Discharge status: Home / Self Care | Payer: Self-pay | Source: Home / Self Care | Attending: Urology

## 2023-08-14 ENCOUNTER — Ambulatory Visit (HOSPITAL_BASED_OUTPATIENT_CLINIC_OR_DEPARTMENT_OTHER): Payer: PRIVATE HEALTH INSURANCE | Admitting: Anesthesiology

## 2023-08-14 DIAGNOSIS — Z01818 Encounter for other preprocedural examination: Secondary | ICD-10-CM

## 2023-08-14 DIAGNOSIS — N201 Calculus of ureter: Secondary | ICD-10-CM

## 2023-08-14 HISTORY — PX: CYSTOSCOPY/URETEROSCOPY/HOLMIUM LASER/STENT PLACEMENT: SHX6546

## 2023-08-14 LAB — POCT PREGNANCY, URINE: Preg Test, Ur: NEGATIVE

## 2023-08-14 SURGERY — CYSTOSCOPY/URETEROSCOPY/HOLMIUM LASER/STENT PLACEMENT
Anesthesia: General | Laterality: Right

## 2023-08-14 MED ORDER — KETOROLAC TROMETHAMINE 30 MG/ML IJ SOLN
INTRAMUSCULAR | Status: DC | PRN
  Administered 2023-08-14: 30 mg via INTRAVENOUS

## 2023-08-14 MED ORDER — CEFAZOLIN SODIUM-DEXTROSE 2-4 GM/100ML-% IV SOLN
2.0000 g | INTRAVENOUS | Status: AC
  Administered 2023-08-14: 2 g via INTRAVENOUS
  Filled 2023-08-14: qty 100

## 2023-08-14 MED ORDER — LACTATED RINGERS IV SOLN: INTRAVENOUS | Status: DC

## 2023-08-14 MED ORDER — SODIUM CHLORIDE 0.9 % IV SOLN: INTRAVENOUS | Status: DC | PRN

## 2023-08-14 MED ORDER — IOHEXOL 300 MG/ML  SOLN
INTRAMUSCULAR | Status: DC | PRN
  Administered 2023-08-14: 19 mL

## 2023-08-14 MED ORDER — DEXAMETHASONE SODIUM PHOSPHATE 10 MG/ML IJ SOLN
INTRAMUSCULAR | Status: DC | PRN
  Administered 2023-08-14: 8 mg via INTRAVENOUS

## 2023-08-14 MED ORDER — FENTANYL CITRATE (PF) 100 MCG/2ML IJ SOLN
INTRAMUSCULAR | Status: DC | PRN
  Administered 2023-08-14: 50 ug via INTRAVENOUS
  Administered 2023-08-14 (×2): 25 ug via INTRAVENOUS

## 2023-08-14 MED ORDER — PROPOFOL 10 MG/ML IV BOLUS
INTRAVENOUS | Status: AC
  Filled 2023-08-14: qty 20

## 2023-08-14 MED ORDER — LIDOCAINE HCL (PF) 2 % IJ SOLN
INTRAMUSCULAR | Status: AC
  Filled 2023-08-14: qty 5

## 2023-08-14 MED ORDER — FENTANYL CITRATE (PF) 100 MCG/2ML IJ SOLN
INTRAMUSCULAR | Status: AC
  Filled 2023-08-14: qty 2

## 2023-08-14 MED ORDER — HYDROCODONE-ACETAMINOPHEN 5-325 MG PO TABS: 1.0000 | ORAL_TABLET | Freq: Four times a day (QID) | ORAL | 0 refills | Status: DC | PRN

## 2023-08-14 MED ORDER — LIDOCAINE HCL URETHRAL/MUCOSAL 2 % EX GEL
CUTANEOUS | Status: DC | PRN
  Administered 2023-08-14: 1 via TOPICAL

## 2023-08-14 MED ORDER — ORAL CARE MOUTH RINSE: 15.0000 mL | Freq: Once | OROMUCOSAL | Status: AC

## 2023-08-14 MED ORDER — DEXAMETHASONE SODIUM PHOSPHATE 10 MG/ML IJ SOLN
INTRAMUSCULAR | Status: AC
  Filled 2023-08-14: qty 1

## 2023-08-14 MED ORDER — ONDANSETRON HCL 4 MG/2ML IJ SOLN
INTRAMUSCULAR | Status: DC | PRN
Start: 2023-08-14 — End: 2023-08-14
  Administered 2023-08-14: 4 mg via INTRAVENOUS

## 2023-08-14 MED ORDER — MIDAZOLAM HCL 2 MG/2ML IJ SOLN
INTRAMUSCULAR | Status: AC
  Filled 2023-08-14: qty 2

## 2023-08-14 MED ORDER — LIDOCAINE HCL (CARDIAC) PF 100 MG/5ML IV SOSY
PREFILLED_SYRINGE | INTRAVENOUS | Status: DC | PRN
  Administered 2023-08-14: 100 mg via INTRATRACHEAL

## 2023-08-14 MED ORDER — SODIUM CHLORIDE 0.9 % IR SOLN
Status: DC | PRN
  Administered 2023-08-14: 3000 mL via INTRAVESICAL

## 2023-08-14 MED ORDER — PHENYLEPHRINE 80 MCG/ML (10ML) SYRINGE FOR IV PUSH (FOR BLOOD PRESSURE SUPPORT)
PREFILLED_SYRINGE | INTRAVENOUS | Status: AC
  Filled 2023-08-14: qty 10

## 2023-08-14 MED ORDER — KETOROLAC TROMETHAMINE 30 MG/ML IJ SOLN
INTRAMUSCULAR | Status: AC
  Filled 2023-08-14: qty 1

## 2023-08-14 MED ORDER — FENTANYL CITRATE PF 50 MCG/ML IJ SOSY
PREFILLED_SYRINGE | INTRAMUSCULAR | Status: AC
  Filled 2023-08-14: qty 3

## 2023-08-14 MED ORDER — FENTANYL CITRATE PF 50 MCG/ML IJ SOSY
25.0000 ug | PREFILLED_SYRINGE | INTRAMUSCULAR | Status: DC | PRN
  Administered 2023-08-14 (×2): 25 ug via INTRAVENOUS

## 2023-08-14 MED ORDER — AMISULPRIDE (ANTIEMETIC) 5 MG/2ML IV SOLN: 10.0000 mg | Freq: Once | INTRAVENOUS | Status: DC | PRN

## 2023-08-14 MED ORDER — CHLORHEXIDINE GLUCONATE 0.12 % MT SOLN
15.0000 mL | Freq: Once | OROMUCOSAL | Status: AC
  Administered 2023-08-14: 15 mL via OROMUCOSAL

## 2023-08-14 MED ORDER — MIDAZOLAM HCL 2 MG/2ML IJ SOLN
INTRAMUSCULAR | Status: DC | PRN
  Administered 2023-08-14: 2 mg via INTRAVENOUS

## 2023-08-14 MED ORDER — LIDOCAINE HCL URETHRAL/MUCOSAL 2 % EX GEL
CUTANEOUS | Status: AC
Start: 2023-08-14 — End: ?
  Filled 2023-08-14: qty 30

## 2023-08-14 MED ORDER — ONDANSETRON HCL 4 MG/2ML IJ SOLN
INTRAMUSCULAR | Status: AC
  Filled 2023-08-14: qty 2

## 2023-08-14 MED ORDER — PROPOFOL 10 MG/ML IV BOLUS
INTRAVENOUS | Status: DC | PRN
Start: 2023-08-14 — End: 2023-08-14
  Administered 2023-08-14: 200 mg via INTRAVENOUS

## 2023-08-14 SURGICAL SUPPLY — 20 items
BAG COUNTER SPONGE SURGICOUNT (BAG) IMPLANT
BAG URO CATCHER STRL LF (MISCELLANEOUS) ×1 IMPLANT
BASKET ZERO TIP NITINOL 2.4FR (BASKET) IMPLANT
CATH URETL OPEN END 6FR 70 (CATHETERS) IMPLANT
CLOTH BEACON ORANGE TIMEOUT ST (SAFETY) ×1 IMPLANT
EXTRACTOR STONE 1.7FRX115CM (UROLOGICAL SUPPLIES) IMPLANT
GLOVE SURG LX STRL 8.0 MICRO (GLOVE) ×1 IMPLANT
GOWN STRL REUS W/ TWL XL LVL3 (GOWN DISPOSABLE) ×1 IMPLANT
GUIDEWIRE STR DUAL SENSOR (WIRE) ×1 IMPLANT
GUIDEWIRE ZIPWRE .038 STRAIGHT (WIRE) IMPLANT
IV NS 1000ML BAXH (IV SOLUTION) ×1 IMPLANT
KIT TURNOVER KIT A (KITS) IMPLANT
MANIFOLD NEPTUNE II (INSTRUMENTS) ×1 IMPLANT
PACK CYSTO (CUSTOM PROCEDURE TRAY) ×1 IMPLANT
SHEATH NAVIGATOR HD 12/14X36 (SHEATH) IMPLANT
SHEATH URETERAL FLEX 12X35 (SHEATH) IMPLANT
STENT URET 6FRX24 CONTOUR (STENTS) IMPLANT
TRACTIP FLEXIVA PULS ID 200XHI (Laser) IMPLANT
TUBING CONNECTING 10 (TUBING) ×1 IMPLANT
TUBING UROLOGY SET (TUBING) ×1 IMPLANT

## 2023-08-14 NOTE — Anesthesia Preprocedure Evaluation (Signed)
 Anesthesia Evaluation  Patient identified by MRN, date of birth, ID band Patient awake    Reviewed: Allergy & Precautions, NPO status , Patient's Chart, lab work & pertinent test results  Airway Mallampati: II  TM Distance: >3 FB Neck ROM: Full    Dental  (+) Dental Advisory Given   Pulmonary neg pulmonary ROS   breath sounds clear to auscultation       Cardiovascular negative cardio ROS  Rhythm:Regular Rate:Normal     Neuro/Psych  Headaches    GI/Hepatic negative GI ROS, Neg liver ROS,,,  Endo/Other  negative endocrine ROS    Renal/GU negative Renal ROS     Musculoskeletal   Abdominal   Peds  Hematology negative hematology ROS (+)   Anesthesia Other Findings   Reproductive/Obstetrics                             Anesthesia Physical Anesthesia Plan  ASA: 2  Anesthesia Plan: General   Post-op Pain Management: Tylenol  PO (pre-op)*   Induction: Intravenous  PONV Risk Score and Plan: 3 and Dexamethasone , Ondansetron , Midazolam  and Treatment may vary due to age or medical condition  Airway Management Planned: LMA  Additional Equipment:   Intra-op Plan:   Post-operative Plan: Extubation in OR  Informed Consent: I have reviewed the patients History and Physical, chart, labs and discussed the procedure including the risks, benefits and alternatives for the proposed anesthesia with the patient or authorized representative who has indicated his/her understanding and acceptance.     Dental advisory given  Plan Discussed with: CRNA  Anesthesia Plan Comments:        Anesthesia Quick Evaluation

## 2023-08-14 NOTE — Anesthesia Procedure Notes (Signed)
 Procedure Name: LMA Insertion Date/Time: 08/14/2023 2:30 PM  Performed by: Darlena Ego, CRNAPre-anesthesia Checklist: Patient identified, Emergency Drugs available, Suction available and Patient being monitored Patient Re-evaluated:Patient Re-evaluated prior to induction Oxygen Delivery Method: Circle System Utilized Preoxygenation: Pre-oxygenation with 100% oxygen Induction Type: IV induction Ventilation: Mask ventilation without difficulty LMA: LMA inserted LMA Size: 4.0 Number of attempts: 1 Airway Equipment and Method: Bite block Placement Confirmation: positive ETCO2 Tube secured with: Tape Dental Injury: Teeth and Oropharynx as per pre-operative assessment

## 2023-08-14 NOTE — Op Note (Signed)
 OPERATIVE NOTE   Patient Name: Cassie  HESTER English  MRN: [id number]   Date of Procedure: 08/14/23    Preoperative diagnosis:  Right ureteral calculus  Postoperative diagnosis:  Right ureteral calculus  Procedure:  Cystoscopy Right retrograde pyelogram with intra-operative interpretation Right ureteroscopy with stone manipulation (obtained) Exchange of right ureteral stent (93F x 24 cm, with tether)  Attending: Mellie Sprinkle, MD  Anesthesia: General  Estimated blood loss: 5 ml  Fluids: Per anesthesia record  Drains: 93F x 24 cm right ureteral stent with tether  Specimens: Stone given to patient  Antibiotics: Ancef  2 gm IV  Findings:  - normal bladder and urethra - 3 x 6 mm calculus in proximal right ureter  Indications:  43 year old female presents for surgical management of a right ureteral calculus.  She presented on 07/19/2023 with right sided flank pain.  CT imaging showed a 6 mm calculus at the right UPJ.  She underwent cystoscopy with right retrograde pyelogram and insertion of a right ureteral stent by Dr. Valeta Gaudier on 07/19/2023.  She presents now for definitive management of the right ureteral calculus with cystoscopy, right retrograde pyelogram, right ureteroscopy with possible laser lithotripsy, and exchange of the right ureteral stent.  The procedure including potential risk has been discussed with the patient in detail.  She understands wishes to proceed as described.  Description of Procedure:  The patient was taken to the operating room suite and properly identified.  After successful induction of a general anesthetic, she was placed in the dorsolithotomy position.  She received Ancef  2 g IV preoperatively.  The patient's genital area was prepped and draped in sterile fashion.  A preoperative timeout was performed.  Under direct visualization a 21 [place] scope was passed through the urethra into the bladder.  The bladder was inspected throughout its entirety.  A  right ureteral stent was seen emanating from the right ureteral orifice.  No other mucosal abnormalities were seen.  Scout film was obtained and showed a right ureteral stent in good position.  There appeared to be a calcification alongside the proximal aspect of the stent consistent with the known ureteral calculus.  Calcifications were also seen overlying the left renal shadow.  A nonspecific bowel gas pattern was seen.  No other bony abnormalities were appreciated. A sensor guidewire was passed alongside the stent into the right renal pelvis under fluoroscopic guidance.  Using stent graspers, the right ureteral stent was removed leaving the guidewire in place.  Ureteroscopy was performed alongside the guidewire.  No stones were seen in the distal and mid ureter.  I was unable to pass the semirigid ureteroscope up to the very proximal ureter where the calcification was located.  A Flexor access sheath was then placed leaving the guidewire in place.  Flexible ureteroscopy was performed through the access sheath and alongside the guidewire into the proximal right ureter.  The stone was identified in the proximal ureter.  Due to the size of the stone, I elected to retrieve the stone with a N[place] stone basket.  The stone was removed without difficulty through the access sheath.  I then passed the ureteroscope back into the proximal ureter.  No other stone fragments were seen.  I then carefully examined the right renal pelvis and calyces.  No other calcifications were appreciated.  Contrast was injected through the ureteroscope outlining the right renal pelvis and calyces.  Leaving the guidewire in place, the ureteroscope and sheath were removed.  The ureter was inspected with removal of  the ureteroscope and no evidence of any injury was identified.  A 6 French by 24 cm double-J stent was then passed over the guidewire and into the right renal pelvis.  The tether was left in place and brought through the urethral  meatus.  Good stent position was confirmed with fluoroscopy.  The bladder was then drained and the cystoscope was removed.  Intraurethral lidocaine  jelly was placed.  The tether was shortened and tucked into the vaginal area.  The patient was then extubated and taken to the post anesthesia care unit in stable condition.  Complications: None  Condition: Stable, extubated, transferred to PACU  Plan:  D/C Home May remove stent in 3-4 days

## 2023-08-14 NOTE — Transfer of Care (Signed)
 Immediate Anesthesia Transfer of Care Note  Patient: Cassie  N English  Procedure(s) Performed: CYSTOSCOPY/URETEROSCOPY/STENT PLACEMENT (Right)  Patient Location: PACU  Anesthesia Type:General  Level of Consciousness: awake, drowsy, and patient cooperative  Airway & Oxygen Therapy: Patient Spontanous Breathing and Patient connected to face mask oxygen  Post-op Assessment: Report given to RN and Post -op Vital signs reviewed and stable  Post vital signs: Reviewed and stable  Last Vitals:  Vitals Value Taken Time  BP 114/82 08/14/23 1517  Temp    Pulse 88 08/14/23 1518  Resp 8 08/14/23 1518  SpO2 100 % 08/14/23 1518  Vitals shown include unfiled device data.  Last Pain:  Vitals:   08/14/23 1214  TempSrc:   PainSc: 0-No pain         Complications: No notable events documented.

## 2023-08-14 NOTE — Interval H&P Note (Signed)
 History and Physical Interval Note:  08/14/2023 2:03 PM  Cassie  N English  has presented today for surgery, with the diagnosis of Calculus of ureter.  The various methods of treatment have been discussed with the patient and family. After consideration of risks, benefits and other options for treatment, the patient has consented to  Procedure(s): CYSTOSCOPY/URETEROSCOPY/HOLMIUM LASER/STENT PLACEMENT (Right) as a surgical intervention.  The patient's history has been reviewed, patient examined, no change in status, stable for surgery.  I have reviewed the patient's chart and labs.  Questions were answered to the patient's satisfaction.     Oda Bence

## 2023-08-15 ENCOUNTER — Encounter (HOSPITAL_COMMUNITY): Payer: Self-pay | Admitting: Urology

## 2023-08-15 ENCOUNTER — Telehealth: Payer: Self-pay | Admitting: Urology

## 2023-08-15 NOTE — Telephone Encounter (Signed)
LMOM requesting return call

## 2023-08-15 NOTE — Telephone Encounter (Signed)
 Pt called and lvm for Cassie English to call he back.

## 2023-08-15 NOTE — Anesthesia Postprocedure Evaluation (Deleted)
 Anesthesia Post Note  Patient: Cassie English  Procedure(s) Performed: CYSTOSCOPY/URETEROSCOPY/STENT PLACEMENT (Right)     Patient location during evaluation: PACU Anesthesia Type: General Level of consciousness: awake and alert Pain management: pain level controlled Vital Signs Assessment: post-procedure vital signs reviewed and stable Respiratory status: spontaneous breathing, nonlabored ventilation and respiratory function stable Cardiovascular status: blood pressure returned to baseline and stable Postop Assessment: no apparent nausea or vomiting Anesthetic complications: no   No notable events documented.  Last Vitals:  Vitals:   08/14/23 1615 08/14/23 1630  BP: 108/86 123/86  Pulse: 85 84  Resp: 14   Temp: (!) 36.4 C   SpO2: 99%     Last Pain:  Vitals:   08/14/23 1615  TempSrc:   PainSc: 0-No pain                 Earvin Goldberg

## 2023-08-15 NOTE — Telephone Encounter (Signed)
 Spoke with pt in reference to post op appt. Pt will come 6/5 at 2:45 for stent removal.

## 2023-08-15 NOTE — Anesthesia Postprocedure Evaluation (Signed)
 Anesthesia Post Note  Patient: Cassie English  Procedure(s) Performed: CYSTOSCOPY/URETEROSCOPY/STENT PLACEMENT (Right)     Anesthesia Type: General Anesthetic complications: no   No notable events documented.  Last Vitals:  Vitals:   08/14/23 1615 08/14/23 1630  BP: 108/86 123/86  Pulse: 85 84  Resp: 14   Temp: (!) 36.4 C   SpO2: 99%     Last Pain:  Vitals:   08/14/23 1615  TempSrc:   PainSc: 0-No pain                 Earvin Goldberg

## 2023-08-16 ENCOUNTER — Ambulatory Visit: Admitting: Urology

## 2023-08-17 ENCOUNTER — Encounter: Payer: Self-pay | Admitting: Urology

## 2023-08-17 ENCOUNTER — Ambulatory Visit (INDEPENDENT_AMBULATORY_CARE_PROVIDER_SITE_OTHER): Admitting: Urology

## 2023-08-17 VITALS — BP 130/89 | HR 97 | Ht 62.0 in | Wt 160.0 lb

## 2023-08-17 DIAGNOSIS — N201 Calculus of ureter: Secondary | ICD-10-CM

## 2023-08-17 DIAGNOSIS — Z09 Encounter for follow-up examination after completed treatment for conditions other than malignant neoplasm: Secondary | ICD-10-CM | POA: Diagnosis not present

## 2023-08-17 DIAGNOSIS — N200 Calculus of kidney: Secondary | ICD-10-CM

## 2023-08-17 DIAGNOSIS — Z87442 Personal history of urinary calculi: Secondary | ICD-10-CM

## 2023-08-17 NOTE — Progress Notes (Signed)
 Nurse pulled pt stent via string per Dr. Willye Harvey. Pt tolerated well. No adverse reaction noted. C.Graceson Nichelson, LPN

## 2023-08-17 NOTE — Progress Notes (Signed)
   Assessment: 1. Calculus of ureter   2. Nephrolithiasis     Plan: Right ureteral stent removed via tether today. Complete antibiotics. Stone sent for analysis. Return to office in 1 month.  Chief Complaint: Chief Complaint  Patient presents with   Nephrolithiasis    HPI: Cassie English is a 43 y.o. female who presents for continued evaluation of right ureteral calculus and nephrolithiasis. She underwent placement of a right ureteral stent by Dr. Valeta Gaudier on 07/19/2023. She was taken back to the operating room on 08/14/2023 for cystoscopy, right retrograde pyelogram, right ureteroscopy with stone manipulation and exchange of a right ureteral stent with tether.  She presents today for stent removal. She has done well since the procedure.  She is having minimal stent related symptoms.  No fevers or chills.   Portions of the above documentation were copied from a prior visit for review purposes only.  Allergies: Allergies  Allergen Reactions   Oxycodone -Acetaminophen  Hives and Itching   Vyvanse [Lisdexamfetamine]     Makes her feel loopy   Codeine Itching   Nubain [Nalbuphine Hcl] Rash   Phenergan [Promethazine Hcl] Rash    IV clicking of tongue    PMH: Past Medical History:  Diagnosis Date   ADD (attention deficit disorder)    Anemia    GAD (generalized anxiety disorder)    History of kidney stones    Migraine     PSH: Past Surgical History:  Procedure Laterality Date   CERVICAL CERCLAGE     CYSTOSCOPY WITH STENT PLACEMENT Right 07/19/2023   Procedure: CYSTOSCOPY, WITH STENT INSERTION;  Surgeon: Roxane Copp, MD;  Location: [facility];  Service: Urology;  Laterality: Right;   CYSTOSCOPY/URETEROSCOPY/HOLMIUM LASER/STENT PLACEMENT Right 08/14/2023   Procedure: CYSTOSCOPY/URETEROSCOPY/STENT PLACEMENT;  Surgeon: Mellie Sprinkle., MD;  Location: [facility];  Service: Urology;  Laterality: Right;   LITHOTRIPSY Bilateral    2018   ORIF FIBULA FRACTURE Right 12/07/2021    Procedure: OPEN REDUCTION INTERNAL FIXATION (ORIF) FIBULA FRACTURE;  Surgeon: Osa Blase, MD;  Location: [facility];  Service: Orthopedics;  Laterality: Right;   WISDOM TOOTH EXTRACTION      SH: Social History   Tobacco Use   Smoking status: Never   Smokeless tobacco: Never  Vaping Use   Vaping status: Never Used  Substance Use Topics   Alcohol use: No    Alcohol/week: 0.0 standard drinks of alcohol   Drug use: Not Currently    Comment: CBD on occasion    ROS: Constitutional:  Negative for fever, chills, weight loss CV: Negative for chest pain, previous MI, hypertension Respiratory:  Negative for shortness of breath, wheezing, sleep apnea, frequent cough GI:  Negative for nausea, vomiting, bloody stool, GERD  PE: LMP 08/07/2023 (Exact Date)  GENERAL APPEARANCE:  Well appearing, well developed, well nourished, NAD HEENT:  Atraumatic, normocephalic, oropharynx clear NECK:  Supple without lymphadenopathy or thyromegaly ABDOMEN:  Soft, non-tender, no masses EXTREMITIES:  Moves all extremities well, without clubbing, cyanosis, or edema NEUROLOGIC:  Alert and oriented x 3, normal gait, CN II-XII grossly intact MENTAL STATUS:  appropriate BACK:  Non-tender to palpation, No CVAT SKIN:  Warm, dry, and intact   Results: None

## 2023-08-25 ENCOUNTER — Other Ambulatory Visit: Payer: Self-pay | Admitting: Family

## 2023-08-25 DIAGNOSIS — F418 Other specified anxiety disorders: Secondary | ICD-10-CM

## 2023-08-28 ENCOUNTER — Encounter: Payer: Self-pay | Admitting: Family

## 2023-08-28 DIAGNOSIS — F419 Anxiety disorder, unspecified: Secondary | ICD-10-CM

## 2023-08-28 DIAGNOSIS — E559 Vitamin D deficiency, unspecified: Secondary | ICD-10-CM

## 2023-08-28 MED ORDER — CHOLECALCIFEROL 1.25 MG (50000 UT) PO TABS: 1.0000 | ORAL_TABLET | ORAL | 0 refills | Status: DC

## 2023-08-28 MED ORDER — FLUOXETINE HCL 20 MG PO CAPS: ORAL_CAPSULE | ORAL | 3 refills | Status: AC

## 2023-08-30 LAB — STONE ANALYSIS
Calcium Oxalate Monohydrate: 10 %
Calcium Phosphate (Hydroxyl): 90 %
Weight Calculi: 57 mg

## 2023-09-27 ENCOUNTER — Encounter: Payer: Self-pay | Admitting: Urology

## 2023-09-27 ENCOUNTER — Other Ambulatory Visit: Payer: Self-pay | Admitting: Family

## 2023-09-27 ENCOUNTER — Ambulatory Visit (INDEPENDENT_AMBULATORY_CARE_PROVIDER_SITE_OTHER): Admitting: Urology

## 2023-09-27 VITALS — BP 118/82 | HR 81 | Ht 62.0 in | Wt 156.0 lb

## 2023-09-27 DIAGNOSIS — N200 Calculus of kidney: Secondary | ICD-10-CM

## 2023-09-27 DIAGNOSIS — F418 Other specified anxiety disorders: Secondary | ICD-10-CM

## 2023-09-27 LAB — URINALYSIS, ROUTINE W REFLEX MICROSCOPIC
Bilirubin, UA: NEGATIVE
Glucose, UA: NEGATIVE
Ketones, UA: NEGATIVE
Nitrite, UA: NEGATIVE
Protein,UA: NEGATIVE
Specific Gravity, UA: 1.015 (ref 1.005–1.030)
Urobilinogen, Ur: 1 mg/dL (ref 0.2–1.0)
pH, UA: 7 (ref 5.0–7.5)

## 2023-09-27 LAB — MICROSCOPIC EXAMINATION

## 2023-09-27 NOTE — Progress Notes (Signed)
 Assessment: 1. Nephrolithiasis     Plan: Stone prevention discussed and information provided. Recommend 24-hour urine for further evaluation for metabolic stone disease. Schedule for renal ultrasound. Will contact with results of above and will arrange follow-up.   Chief Complaint: Chief Complaint  Patient presents with   Nephrolithiasis    HPI: Cassie  N English is a 43 y.o. female who presents for continued evaluation of right ureteral calculus and nephrolithiasis. CT renal stone study from 07/18/2023 showed nonobstructing stones in the left kidney with the largest measuring 9 mm, scattered small nonobstructing stones in the right kidney, and a 6 mm stone at the right UPJ. She underwent placement of a right ureteral stent by Dr. Elisabeth on 07/19/2023. She was taken back to the operating room on 08/14/2023 for cystoscopy, right retrograde pyelogram, right ureteroscopy with stone manipulation and exchange of a right ureteral stent with tether. Her stent was removed on 08/17/2023.  Stone analysis: 10% calcium oxalate monohydrate, 90% calcium phosphate.  She returns today for follow-up.  She has done well following stent removal.  She is not having any right-sided flank pain.  She does report an occasional achy sensation in the left flank area.  No dysuria or gross hematuria.  Portions of the above documentation were copied from a prior visit for review purposes only.  Allergies: Allergies  Allergen Reactions   Oxycodone -Acetaminophen  Hives and Itching   Vyvanse [Lisdexamfetamine]     Makes her feel loopy   Codeine Itching   Nubain [Nalbuphine Hcl] Rash   Phenergan [Promethazine Hcl] Rash    IV clicking of tongue    PMH: Past Medical History:  Diagnosis Date   ADD (attention deficit disorder)    Anemia    GAD (generalized anxiety disorder)    History of kidney stones    Migraine     PSH: Past Surgical History:  Procedure Laterality Date   CERVICAL CERCLAGE      CYSTOSCOPY WITH STENT PLACEMENT Right 07/19/2023   Procedure: CYSTOSCOPY, WITH STENT INSERTION;  Surgeon: Elisabeth Valli BIRCH, MD;  Location: [facility];  Service: Urology;  Laterality: Right;   CYSTOSCOPY/URETEROSCOPY/HOLMIUM LASER/STENT PLACEMENT Right 08/14/2023   Procedure: CYSTOSCOPY/URETEROSCOPY/STENT PLACEMENT;  Surgeon: Roseann Adine PARAS., MD;  Location: [facility];  Service: Urology;  Laterality: Right;   LITHOTRIPSY Bilateral    2018   ORIF FIBULA FRACTURE Right 12/07/2021   Procedure: OPEN REDUCTION INTERNAL FIXATION (ORIF) FIBULA FRACTURE;  Surgeon: Josefina Chew, MD;  Location: [facility];  Service: Orthopedics;  Laterality: Right;   WISDOM TOOTH EXTRACTION      SH: Social History   Tobacco Use   Smoking status: Never   Smokeless tobacco: Never  Vaping Use   Vaping status: Never Used  Substance Use Topics   Alcohol use: No    Alcohol/week: 0.0 standard drinks of alcohol   Drug use: Not Currently    Comment: CBD on occasion    ROS: Constitutional:  Negative for fever, chills, weight loss CV: Negative for chest pain, previous MI, hypertension Respiratory:  Negative for shortness of breath, wheezing, sleep apnea, frequent cough GI:  Negative for nausea, vomiting, bloody stool, GERD  PE: BP 118/82   Pulse 81   Ht 5' 2 (1.575 m)   Wt 156 lb (70.8 kg)   BMI 28.53 kg/m  GENERAL APPEARANCE:  Well appearing, well developed, well nourished, NAD HEENT:  Atraumatic, normocephalic, oropharynx clear NECK:  Supple without lymphadenopathy or thyromegaly ABDOMEN:  Soft, non-tender, no masses EXTREMITIES:  Moves all extremities  well, without clubbing, cyanosis, or edema NEUROLOGIC:  Alert and oriented x 3, normal gait, CN II-XII grossly intact MENTAL STATUS:  appropriate BACK:  Non-tender to palpation, No CVAT SKIN:  Warm, dry, and intact   Results: U/A: 0-5 to BCs, 0-2 RBCs, pH 7.0

## 2023-10-01 ENCOUNTER — Encounter: Payer: Self-pay | Admitting: Family

## 2023-10-01 ENCOUNTER — Ambulatory Visit (HOSPITAL_BASED_OUTPATIENT_CLINIC_OR_DEPARTMENT_OTHER)

## 2023-10-01 DIAGNOSIS — R41840 Attention and concentration deficit: Secondary | ICD-10-CM

## 2023-10-04 NOTE — Telephone Encounter (Signed)
Please see the MyChart message reply(ies) for my assessment and plan.  The patient gave consent for this Medical Advice Message and is aware that it may result in a bill to their insurance company as well as the possibility that this may result in a co-payment or deductible. They are an established patient, but are not seeking medical advice exclusively about a problem treated during an in person or video visit in the last 7 days. I did not recommend an in person or video visit within 7 days of my reply.  I spent a total of 2 minutes cumulative time within 7 days through [employer] Mort Sawyers, FNP

## 2023-10-06 ENCOUNTER — Ambulatory Visit (HOSPITAL_BASED_OUTPATIENT_CLINIC_OR_DEPARTMENT_OTHER)
Admission: RE | Admit: 2023-10-06 | Discharge: 2023-10-06 | Disposition: A | Discharge status: Home / Self Care | Payer: PRIVATE HEALTH INSURANCE | Source: Ambulatory Visit | Attending: Urology | Admitting: Urology

## 2023-10-06 DIAGNOSIS — N200 Calculus of kidney: Secondary | ICD-10-CM | POA: Diagnosis present

## 2023-10-06 MED ORDER — AMPHETAMINE-DEXTROAMPHET ER 10 MG PO CP24: 10.0000 mg | ORAL_CAPSULE | Freq: Every day | ORAL | 0 refills | Status: DC

## 2023-10-06 NOTE — Addendum Note (Signed)
 Addended by: CORWIN ANTU on: 10/06/2023 07:19 AM   Modules accepted: Orders

## 2023-10-08 ENCOUNTER — Ambulatory Visit: Payer: Self-pay | Admitting: Urology

## 2023-10-09 ENCOUNTER — Other Ambulatory Visit: Payer: Self-pay | Admitting: Family

## 2023-10-09 DIAGNOSIS — E559 Vitamin D deficiency, unspecified: Secondary | ICD-10-CM

## 2023-10-13 ENCOUNTER — Other Ambulatory Visit: Payer: Self-pay | Admitting: Family

## 2023-10-13 DIAGNOSIS — E559 Vitamin D deficiency, unspecified: Secondary | ICD-10-CM

## 2023-10-30 ENCOUNTER — Encounter: Payer: Self-pay | Admitting: *Deleted

## 2023-11-16 ENCOUNTER — Encounter: Payer: Self-pay | Admitting: Family

## 2023-11-16 DIAGNOSIS — R41840 Attention and concentration deficit: Secondary | ICD-10-CM

## 2023-11-17 ENCOUNTER — Encounter: Payer: Self-pay | Admitting: Family

## 2023-11-19 ENCOUNTER — Other Ambulatory Visit: Payer: Self-pay | Admitting: Urology

## 2023-11-29 ENCOUNTER — Ambulatory Visit: Payer: Self-pay | Admitting: Family

## 2023-11-29 ENCOUNTER — Ambulatory Visit (INDEPENDENT_AMBULATORY_CARE_PROVIDER_SITE_OTHER)
Admission: RE | Admit: 2023-11-29 | Discharge: 2023-11-29 | Disposition: A | Discharge status: Home / Self Care | Source: Ambulatory Visit | Attending: Family | Admitting: Family

## 2023-11-29 ENCOUNTER — Ambulatory Visit: Payer: PRIVATE HEALTH INSURANCE | Admitting: Family

## 2023-11-29 ENCOUNTER — Encounter: Payer: Self-pay | Admitting: Family

## 2023-11-29 VITALS — BP 104/74 | HR 80 | Temp 98.2°F | Ht 62.0 in | Wt 165.4 lb

## 2023-11-29 DIAGNOSIS — F418 Other specified anxiety disorders: Secondary | ICD-10-CM

## 2023-11-29 DIAGNOSIS — R739 Hyperglycemia, unspecified: Secondary | ICD-10-CM

## 2023-11-29 DIAGNOSIS — E611 Iron deficiency: Secondary | ICD-10-CM

## 2023-11-29 DIAGNOSIS — R062 Wheezing: Secondary | ICD-10-CM

## 2023-11-29 DIAGNOSIS — E282 Polycystic ovarian syndrome: Secondary | ICD-10-CM

## 2023-11-29 DIAGNOSIS — E559 Vitamin D deficiency, unspecified: Secondary | ICD-10-CM

## 2023-11-29 DIAGNOSIS — R5383 Other fatigue: Secondary | ICD-10-CM | POA: Diagnosis not present

## 2023-11-29 DIAGNOSIS — E7800 Pure hypercholesterolemia, unspecified: Secondary | ICD-10-CM | POA: Diagnosis not present

## 2023-11-29 DIAGNOSIS — R41840 Attention and concentration deficit: Secondary | ICD-10-CM

## 2023-11-29 DIAGNOSIS — J40 Bronchitis, not specified as acute or chronic: Secondary | ICD-10-CM

## 2023-11-29 DIAGNOSIS — G43709 Chronic migraine without aura, not intractable, without status migrainosus: Secondary | ICD-10-CM

## 2023-11-29 DIAGNOSIS — Z3041 Encounter for surveillance of contraceptive pills: Secondary | ICD-10-CM

## 2023-11-29 LAB — CBC
HCT: 40.7 % (ref 36.0–46.0)
Hemoglobin: 13.1 g/dL (ref 12.0–15.0)
MCHC: 32.1 g/dL (ref 30.0–36.0)
MCV: 84.3 fl (ref 78.0–100.0)
Platelets: 267 K/uL (ref 150.0–400.0)
RBC: 4.82 Mil/uL (ref 3.87–5.11)
RDW: 13.9 % (ref 11.5–15.5)
WBC: 5.4 K/uL (ref 4.0–10.5)

## 2023-11-29 LAB — IBC + FERRITIN
Ferritin: 12.1 ng/mL (ref 10.0–291.0)
Iron: 47 ug/dL (ref 42–145)
Saturation Ratios: 9.7 % — ABNORMAL LOW (ref 20.0–50.0)
TIBC: 485.8 ug/dL — ABNORMAL HIGH (ref 250.0–450.0)
Transferrin: 347 mg/dL (ref 212.0–360.0)

## 2023-11-29 LAB — LIPID PANEL
Cholesterol: 219 mg/dL — ABNORMAL HIGH (ref 0–200)
HDL: 47.8 mg/dL (ref >39.00)
LDL Cholesterol: 150 mg/dL — ABNORMAL HIGH (ref 0–99)
NonHDL: 171.64
Total CHOL/HDL Ratio: 5
Triglycerides: 110 mg/dL (ref 0.0–149.0)
VLDL: 22 mg/dL (ref 0.0–40.0)

## 2023-11-29 LAB — POCT URINE PREGNANCY: Preg Test, Ur: NEGATIVE

## 2023-11-29 LAB — VITAMIN B12: Vitamin B-12: 117 pg/mL — ABNORMAL LOW (ref 211–911)

## 2023-11-29 LAB — TSH: TSH: 0.74 u[IU]/mL (ref 0.35–5.50)

## 2023-11-29 LAB — HEMOGLOBIN A1C: Hgb A1c MFr Bld: 5.5 % (ref 4.6–6.5)

## 2023-11-29 LAB — VITAMIN D 25 HYDROXY (VIT D DEFICIENCY, FRACTURES): VITD: 19.78 ng/mL — ABNORMAL LOW (ref 30.00–100.00)

## 2023-11-29 MED ORDER — HYDROXYZINE PAMOATE 25 MG PO CAPS: 25.0000 mg | ORAL_CAPSULE | Freq: Three times a day (TID) | ORAL | 1 refills | Status: DC | PRN

## 2023-11-29 MED ORDER — AMPHETAMINE-DEXTROAMPHET ER 10 MG PO CP24: 10.0000 mg | ORAL_CAPSULE | Freq: Every day | ORAL | 0 refills | Status: DC

## 2023-11-29 MED ORDER — PREDNISONE 10 MG (21) PO TBPK: ORAL_TABLET | ORAL | 0 refills | Status: DC

## 2023-11-29 MED ORDER — ALBUTEROL SULFATE HFA 108 (90 BASE) MCG/ACT IN AERS: 2.0000 | INHALATION_SPRAY | Freq: Four times a day (QID) | RESPIRATORY_TRACT | 0 refills | Status: DC | PRN

## 2023-11-29 MED ORDER — TOPIRAMATE 100 MG PO TABS: 100.0000 mg | ORAL_TABLET | Freq: Every day | ORAL | 1 refills | Status: AC

## 2023-11-29 MED ORDER — DOXYCYCLINE HYCLATE 100 MG PO TABS: 100.0000 mg | ORAL_TABLET | Freq: Two times a day (BID) | ORAL | 0 refills | Status: AC

## 2023-11-29 MED ORDER — RIZATRIPTAN BENZOATE 10 MG PO TABS: 10.0000 mg | ORAL_TABLET | ORAL | 2 refills | Status: AC | PRN

## 2023-11-29 NOTE — Progress Notes (Signed)
 "  Established Patient Office Visit  Subjective:      CC:  Chief Complaint  Patient presents with   Medical Management of Chronic Issues    HPI: Cassie English is a 43 y.o. female presenting on 11/29/2023 for Medical Management of Chronic Issues .  Discussed the use of AI scribe software for clinical note transcription with the patient, who gave verbal consent to proceed.  History of Present Illness Cassie  N English is a 43 year old female with recurrent bronchitis who presents with persistent chest congestion and cough.  She has been experiencing persistent chest congestion and cough for the past two weeks. Initially, she had congestion and a sore throat, which progressed to chest congestion. The sore throat has resolved, but she continues to experience chest tightness and a productive cough with yellow sputum. No ear pain or fever, but she has mild headaches. She experiences shortness of breath and wheezing, although she has no history of asthma. She frequently experiences bronchitis.  She has a history of migraines, which were previously managed with Topamax . However, she had to discontinue Topamax  for a month due to kidney stones, which her urologist attributed to increased calcium levels from the medication. During this period, her migraines worsened. She has since resumed Topamax  at 100 mg once daily, which has been effective in managing her migraines.  She has a history of polycystic ovary syndrome (PCOS) and previously attempted treatment with metformin , which she discontinued due to gastrointestinal side effects. She also has a history of high cholesterol and is currently taking Adderall XR 10 mg daily for attention deficit disorder, which she finds effective when combined with hydroxyzine  at night to aid sleep.  She takes Allegra for allergies and has tried Claritin in the past without significant relief. She also uses Mucinex D but finds it insufficient in breaking up  chest congestion. She uses a humidifier and saline to manage symptoms.  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         Social history:  Relevant past medical, surgical, family and social history reviewed and updated as indicated. Interim medical history since our last visit reviewed.  Allergies and medications reviewed and updated.  DATA REVIEWED: CHART IN EPIC     ROS: Negative unless specifically indicated above in HPI.    Current Outpatient Medications:    acetaminophen  (TYLENOL ) 500 MG tablet, Take 1,000 mg by mouth every 6 (six) hours as needed for moderate pain., Disp: , Rfl:    albuterol  (VENTOLIN  HFA) 108 (90 Base) MCG/ACT inhaler, Inhale 2 puffs into the lungs every 6 (six) hours as needed for wheezing or shortness of breath., Disp: 8 g, Rfl: 0   doxycycline  (VIBRA -TABS) 100 MG tablet, Take 1 tablet (100 mg total) by mouth 2 (two) times daily for 10 days., Disp: 20 tablet, Rfl: 0   FLUoxetine  (PROZAC ) 20 MG capsule, Take 1 capsule daily, 2 capsules during menses, Disp: 105 capsule, Rfl: 3   Iron , Ferrous Sulfate , 325 (65 Fe) MG TABS, Take 325 mg by mouth every other day., Disp: , Rfl:    norethindrone  (ORTHO MICRONOR ) 0.35 MG tablet, Take one od for continuous usage, do not use placebos just start new pack, Disp: 112 tablet, Rfl: 3   predniSONE  (STERAPRED UNI-PAK 21 TAB) 10 MG (21) TBPK tablet, Take as directed, Disp: 1 each, Rfl: 0   amphetamine -dextroamphetamine  (  ADDERALL XR) 10 MG 24 hr capsule, Take 1 capsule (10 mg total) by mouth daily., Disp: 30 capsule, Rfl: 0   [START ON 12/20/2023] amphetamine -dextroamphetamine  (ADDERALL XR) 10 MG 24 hr capsule, Take 1 capsule (10 mg total) by mouth daily., Disp: 30 capsule, Rfl: 0   [START ON 01/10/2024] amphetamine -dextroamphetamine  (ADDERALL XR) 10 MG 24 hr capsule, Take 1  capsule (10 mg total) by mouth daily., Disp: 30 capsule, Rfl: 0   hydrOXYzine  (VISTARIL ) 25 MG capsule, Take 1 capsule (25 mg total) by mouth 3 (three) times daily as needed., Disp: 30 capsule, Rfl: 1   rizatriptan  (MAXALT ) 10 MG tablet, Take 1 tablet (10 mg total) by mouth as needed for migraine. May repeat in 2 hours if needed, Disp: 10 tablet, Rfl: 2   topiramate  (TOPAMAX ) 100 MG tablet, Take 1 tablet (100 mg total) by mouth daily., Disp: 90 tablet, Rfl: 1        Objective:        BP 104/74 (BP Location: Left Arm, Patient Position: Sitting, Cuff Size: Normal)   Pulse 80   Temp 98.2 F (36.8 C) (Temporal)   Ht 5' 2 (1.575 m)   Wt 165 lb 6.4 oz (75 kg)   SpO2 98%   BMI 30.25 kg/m   Physical Exam CHEST: Wheezing present in the right upper and lower lobes on expiration.  Wt Readings from Last 3 Encounters:  11/29/23 165 lb 6.4 oz (75 kg)  09/27/23 156 lb (70.8 kg)  08/17/23 160 lb (72.6 kg)    Physical Exam Constitutional:      General: She is not in acute distress.    Appearance: Normal appearance. She is normal weight. She is not ill-appearing, toxic-appearing or diaphoretic.  HENT:     Head: Normocephalic.     Right Ear: Tympanic membrane normal.     Left Ear: Tympanic membrane normal.     Nose: Nose normal.     Mouth/Throat:     Mouth: Mucous membranes are dry.     Pharynx: No oropharyngeal exudate or posterior oropharyngeal erythema.  Eyes:     Extraocular Movements: Extraocular movements intact.     Pupils: Pupils are equal, round, and reactive to light.  Cardiovascular:     Rate and Rhythm: Normal rate and regular rhythm.     Pulses: Normal pulses.     Heart sounds: Normal heart sounds.  Pulmonary:     Effort: Pulmonary effort is normal.     Breath sounds: Examination of the right-upper field reveals wheezing. Examination of the right-middle field reveals wheezing. Examination of the right-lower field reveals wheezing and rhonchi. Wheezing and rhonchi  present.  Musculoskeletal:        General: Normal range of motion.     Cervical back: Normal range of motion.  Neurological:     General: No focal deficit present.     Mental Status: She is alert and oriented to person, place, and time. Mental status is at baseline.  Psychiatric:        Mood and Affect: Mood normal.        Behavior: Behavior normal.        Thought Content: Thought content normal.        Judgment: Judgment normal.          Results   Assessment & Plan:   Assessment and Plan Assessment & Plan Acute bronchitis with right-sided wheezing, possible pneumonia Persistent cough with yellow sputum, right-sided wheezing, and chest tightness for two weeks. Possible  pneumonia indicated by wheezing on the right side, particularly in the upper and lower lobes. No history of asthma, but frequent bronchitis episodes. No fever or ear pain reported. - Order chest x-ray to evaluate for pneumonia - Prescribe albuterol  inhaler for wheezing and chest tightness - Prescribe prednisone  pack to reduce inflammation and open airways - Prescribe doxycycline  for potential bacterial infection  Chronic migraine without aura Migraines were poorly controlled during a month-long discontinuation of topiramate  due to kidney stones. Currently managed with topiramate  100 mg once daily, which is effective in controlling migraines. - Continue topiramate  100 mg once daily - Prescribe rizatriptan  for acute migraine attacks  Nephrolithiasis, left kidney Current stone measuring 1.3 to 1.6 cm in the left kidney. Topiramate  was previously discontinued due to exacerbation of kidney stones but has been resumed at a lower dose.  Polycystic ovarian syndrome (PCOS) Metformin  was previously trialed but discontinued due to gastrointestinal side effects.  Attention-deficit hyperactivity disorder (ADHD) ADHD symptoms are well-managed with Adderall XR 10 mg daily. She reports improved focus and concentration at  work. No adverse effects such as chest pain or palpitations noted. - Continue Adderall XR 10 mg daily - Prescribe hydroxyzine  for sleep aid  Depression Depression is managed with fluoxetine  20 mg daily.  Recording duration: 14 minutes      Return in about 6 months (around 05/28/2024) for f/u CPE.     Ginger Patrick, MSN, APRN, FNP-C [facility]     "

## 2023-11-30 MED ORDER — CHOLECALCIFEROL 1.25 MG (50000 UT) PO TABS: 1.0000 | ORAL_TABLET | ORAL | 0 refills | Status: AC

## 2023-12-11 ENCOUNTER — Encounter: Payer: Self-pay | Admitting: Urology

## 2023-12-13 ENCOUNTER — Telehealth: Payer: Self-pay | Admitting: Urology

## 2023-12-13 NOTE — Telephone Encounter (Signed)
 Called and lvm to schedule follow-up visit in January 2026. Per Dr Roseann.

## 2023-12-19 ENCOUNTER — Telehealth: Payer: Self-pay | Admitting: Urology

## 2023-12-19 NOTE — Telephone Encounter (Signed)
 Called and lvm on 12/19/23

## 2023-12-19 NOTE — Telephone Encounter (Signed)
-----   Message from Adine Manly sent at 12/13/2023  2:11 PM EDT ----- Please schedule for a follow-up visit in January 2026.

## 2024-01-04 LAB — LITHOLINK 24HR URINE PANEL
Ammonium, Urine: 16 mmol/(24.h) (ref 15–60)
Calcium Oxalate Saturation: 7.4 (ref 6.00–10.00)
Calcium Phosphate Saturation: 2.75 — ABNORMAL HIGH (ref 0.50–2.00)
Calcium, Urine: 196 mg/(24.h) (ref <200)
Calcium/Creatinine Ratio: 178 mg/g{creat} (ref 51–262)
Calcium/Kg Body Weight: 2.7 mg/kg/d (ref <4.0)
Chloride, Urine: 287 mmol/(24.h) — ABNORMAL HIGH (ref 70–250)
Citrate, Urine: 164 mg/(24.h) — ABNORMAL LOW (ref >550)
Creatinine, Urine: 1104 mg/(24.h)
Creatinine/Kg Body Weight: 14.9 mg/kg/d (ref 8.7–20.3)
Cystine, Urine, Qualitative: NEGATIVE
Magnesium, Urine: 89 mg/(24.h) (ref 30–120)
Oxalate, Urine: 38 mg/(24.h) (ref 20–40)
Phosphorus, Urine: 690 mg/(24.h) (ref 600–1200)
Potassium, Urine: 49 mmol/(24.h) (ref 20–100)
Protein Catabolic Rate: 0.6 g/kg/d — ABNORMAL LOW (ref 0.8–1.4)
Sodium, Urine: 305 mmol/(24.h) — ABNORMAL HIGH (ref 50–150)
Sulfate, Urine: 16 meq/(24.h) — ABNORMAL LOW (ref 20–80)
Urea Nitrogen, Urine: 5.16 g/(24.h) — ABNORMAL LOW (ref 6.00–14.00)
Uric Acid Saturation: 0.04 (ref <1.00)
Uric Acid, Urine: 544 mg/(24.h) (ref <750)
Urine Volume (Preserved): 1500 mL/(24.h) (ref 500–4000)
pH, 24 hr, Urine: 7.332 — ABNORMAL HIGH (ref 5.800–6.200)

## 2024-01-05 ENCOUNTER — Encounter: Payer: Self-pay | Admitting: Urology

## 2024-01-09 ENCOUNTER — Encounter: Payer: Self-pay | Admitting: Family

## 2024-01-18 ENCOUNTER — Other Ambulatory Visit: Payer: Self-pay | Admitting: Family

## 2024-01-18 DIAGNOSIS — E282 Polycystic ovarian syndrome: Secondary | ICD-10-CM

## 2024-02-22 ENCOUNTER — Other Ambulatory Visit: Payer: Self-pay | Admitting: Family

## 2024-02-22 DIAGNOSIS — F418 Other specified anxiety disorders: Secondary | ICD-10-CM

## 2024-03-07 ENCOUNTER — Telehealth: Admitting: Physician Assistant

## 2024-03-07 DIAGNOSIS — R6889 Other general symptoms and signs: Secondary | ICD-10-CM

## 2024-03-07 DIAGNOSIS — Z20828 Contact with and (suspected) exposure to other viral communicable diseases: Secondary | ICD-10-CM

## 2024-03-07 MED ORDER — PSEUDOEPH-BROMPHEN-DM 30-2-10 MG/5ML PO SYRP: 5.0000 mL | ORAL_SOLUTION | Freq: Four times a day (QID) | ORAL | 0 refills | Status: AC | PRN

## 2024-03-07 MED ORDER — OSELTAMIVIR PHOSPHATE 75 MG PO CAPS: 75.0000 mg | ORAL_CAPSULE | Freq: Two times a day (BID) | ORAL | 0 refills | Status: DC

## 2024-03-07 MED ORDER — NAPROXEN 500 MG PO TABS: 500.0000 mg | ORAL_TABLET | Freq: Two times a day (BID) | ORAL | 0 refills | Status: DC

## 2024-03-07 NOTE — Progress Notes (Signed)
 " Virtual Visit Consent   Cassie English, you are scheduled for a virtual visit with a  provider today. Just as with appointments in the office, your consent must be obtained to participate. Your consent will be active for this visit and any virtual visit you may have with one of our providers in the next 365 days. If you have a MyChart account, a copy of this consent can be sent to you electronically.  As this is a virtual visit, video technology does not allow for your provider to perform a traditional examination. This may limit your provider's ability to fully assess your condition. If your provider identifies any concerns that need to be evaluated in person or the need to arrange testing (such as labs, EKG, etc.), we will make arrangements to do so. Although advances in technology are sophisticated, we cannot ensure that it will always work on either your end or our end. If the connection with a video visit is poor, the visit may have to be switched to a telephone visit. With either a video or telephone visit, we are not always able to ensure that we have a secure connection.  By engaging in this virtual visit, you consent to the provision of healthcare and authorize for your insurance to be billed (if applicable) for the services provided during this visit. Depending on your insurance coverage, you may receive a charge related to this service.  I need to obtain your verbal consent now. Are you willing to proceed with your visit today? Cassie English has provided verbal consent on 03/07/2024 for a virtual visit (video or telephone). Delon CHRISTELLA Dickinson, PA-C  Date: 03/07/2024 8:45 AM   Virtual Visit via Video Note   I, Delon CHRISTELLA Dickinson, connected with  Cassie English  ([id number], [birth year]) on 03/07/2024 at  8:30 AM EST by a video-enabled telemedicine application and verified that I am speaking with the correct person using two identifiers.  Location: Patient: Virtual  Visit Location Patient: Home Provider: Virtual Visit Location Provider: Home Office   I discussed the limitations of evaluation and management by telemedicine and the availability of in person appointments. The patient expressed understanding and agreed to proceed.    History of Present Illness: Cassie English is a 43 y.o. who identifies as a female who was assigned female at birth, and is being seen today for flu-like symptoms.  HPI: URI  This is a new problem. The current episode started in the past 7 days (Tuesday night, 03/05/24). The problem has been gradually worsening. The maximum temperature recorded prior to her arrival was 100.4 - 100.9 F. The fever has been present for 1 to 2 days. Associated symptoms include chest pain (heaviness in chest), congestion, coughing, headaches, rhinorrhea, sinus pain and a sore throat. Pertinent negatives include no diarrhea, ear pain, nausea, plugged ear sensation, vomiting or wheezing. Associated symptoms comments: Body aches, fatigue, chills.   Has had 2 people at work with Flu.    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   Cervical high risk HPV (human papillomavirus) test positive 03/13/2023   Iron  deficiency 03/13/2023   PMDD (premenstrual dysphoric disorder) 01/03/2023   Depression with anxiety 01/03/2023   Chronic pain of right ankle 01/03/2023   Elevated LDL cholesterol level 01/03/2023   Vitamin D  deficiency 01/03/2023   Attention or concentration deficit 01/03/2023   ASCUS with positive  high risk HPV cervical 01/03/2023   PCOS (polycystic ovarian syndrome) 01/10/2014   Migraine     Allergies: Allergies[1] Medications: Current Medications[2]  Observations/Objective: Patient is well-developed, well-nourished in no acute distress.  Resting comfortably at home.  Head is normocephalic, atraumatic.  No labored breathing.  Speech is clear and  coherent with logical content.  Patient is alert and oriented at baseline.    Assessment and Plan: 1. Exposure to the flu (Primary) - oseltamivir  (TAMIFLU ) 75 MG capsule; Take 1 capsule (75 mg total) by mouth 2 (two) times Cassie.  Dispense: 10 capsule; Refill: 0 - brompheniramine-pseudoephedrine-DM 30-2-10 MG/5ML syrup; Take 5 mLs by mouth 4 (four) times Cassie as needed.  Dispense: 120 mL; Refill: 0 - naproxen  (NAPROSYN ) 500 MG tablet; Take 1 tablet (500 mg total) by mouth 2 (two) times Cassie with a meal.  Dispense: 30 tablet; Refill: 0  2. Flu-like symptoms - oseltamivir  (TAMIFLU ) 75 MG capsule; Take 1 capsule (75 mg total) by mouth 2 (two) times Cassie.  Dispense: 10 capsule; Refill: 0 - brompheniramine-pseudoephedrine-DM 30-2-10 MG/5ML syrup; Take 5 mLs by mouth 4 (four) times Cassie as needed.  Dispense: 120 mL; Refill: 0 - naproxen  (NAPROSYN ) 500 MG tablet; Take 1 tablet (500 mg total) by mouth 2 (two) times Cassie with a meal.  Dispense: 30 tablet; Refill: 0  - Suspect influenza due to symptoms and positive exposure - Tamiflu  prescribed - Bromfed DM for cough - Naproxen  for body aches and pain - Continue OTC medication of choice for symptomatic management - Push fluids - Rest - Seek in person evaluation if symptoms worsen or fail to improve   Follow Up Instructions: I discussed the assessment and treatment plan with the patient. The patient was provided an opportunity to ask questions and all were answered. The patient agreed with the plan and demonstrated an understanding of the instructions.  A copy of instructions were sent to the patient via MyChart unless otherwise noted below.    The patient was advised to call back or seek an in-person evaluation if the symptoms worsen or if the condition fails to improve as anticipated.    Delon CHRISTELLA Dickinson, PA-C     [1]  Allergies Allergen Reactions   Oxycodone -Acetaminophen  Hives and Itching   Vyvanse [Lisdexamfetamine]      Makes her feel loopy   Codeine Itching   Metformin  And Related Other (See Comments)    Diarrhea    Nubain [Nalbuphine Hcl] Rash   Phenergan [Promethazine Hcl] Rash    IV clicking of tongue  [2]  Current Outpatient Medications:    brompheniramine-pseudoephedrine-DM 30-2-10 MG/5ML syrup, Take 5 mLs by mouth 4 (four) times Cassie as needed., Disp: 120 mL, Rfl: 0   hydrOXYzine  (VISTARIL ) 25 MG capsule, TAKE ONE CAPSULE BY MOUTH THREE TIMES A DAY AS NEEDED, Disp: 30 capsule, Rfl: 1   naproxen  (NAPROSYN ) 500 MG tablet, Take 1 tablet (500 mg total) by mouth 2 (two) times Cassie with a meal., Disp: 30 tablet, Rfl: 0   oseltamivir  (TAMIFLU ) 75 MG capsule, Take 1 capsule (75 mg total) by mouth 2 (two) times Cassie., Disp: 10 capsule, Rfl: 0   acetaminophen  (TYLENOL ) 500 MG tablet, Take 1,000 mg by mouth every 6 (six) hours as needed for moderate pain., Disp: , Rfl:    albuterol  (VENTOLIN  HFA) 108 (90 Base) MCG/ACT inhaler, Inhale 2 puffs into the lungs every 6 (six) hours as needed for wheezing or shortness of breath., Disp: 8 g, Rfl: 0   amphetamine -dextroamphetamine  (ADDERALL  XR) 10 MG 24 hr capsule, Take 1 capsule (10 mg total) by mouth Cassie., Disp: 30 capsule, Rfl: 0   amphetamine -dextroamphetamine  (ADDERALL XR) 10 MG 24 hr capsule, Take 1 capsule (10 mg total) by mouth Cassie., Disp: 30 capsule, Rfl: 0   amphetamine -dextroamphetamine  (ADDERALL XR) 10 MG 24 hr capsule, Take 1 capsule (10 mg total) by mouth Cassie., Disp: 30 capsule, Rfl: 0   Cholecalciferol  1.25 MG (50000 UT) TABS, Take 1 tablet by mouth once a week., Disp: 12 tablet, Rfl: 0   FLUoxetine  (PROZAC ) 20 MG capsule, Take 1 capsule Cassie, 2 capsules during menses, Disp: 105 capsule, Rfl: 3   Iron , Ferrous Sulfate , 325 (65 Fe) MG TABS, Take 325 mg by mouth every other day., Disp: , Rfl:    JENCYCLA  0.35 MG tablet, TAKE ONE TABLET BY MOUTH Cassie FOR CONTINUOUS USE. START NEW PACK AFTER FINISHING PEVIOUS PACK, Disp: 112 tablet, Rfl: 1    predniSONE  (STERAPRED UNI-PAK 21 TAB) 10 MG (21) TBPK tablet, Take as directed, Disp: 1 each, Rfl: 0   rizatriptan  (MAXALT ) 10 MG tablet, Take 1 tablet (10 mg total) by mouth as needed for migraine. May repeat in 2 hours if needed, Disp: 10 tablet, Rfl: 2   topiramate  (TOPAMAX ) 100 MG tablet, Take 1 tablet (100 mg total) by mouth Cassie., Disp: 90 tablet, Rfl: 1  "

## 2024-03-07 NOTE — Patient Instructions (Signed)
 " Cassie English, thank you for joining Delon CHRISTELLA Dickinson, PA-C for today's virtual visit.  While this provider is not your primary care provider (PCP), if your PCP is located in our provider database this encounter information will be shared with them immediately following your visit.   A [facility] MyChart account gives you access to today's visit and all your visits, tests, and labs performed at [facility]  click here if you don't have a [facility] MyChart account or go to [email or web address]  Consent: (Patient) Cassie English provided verbal consent for this virtual visit at the beginning of the encounter.  Current Medications:  Current Outpatient Medications:    brompheniramine-pseudoephedrine-DM 30-2-10 MG/5ML syrup, Take 5 mLs by mouth 4 (four) times daily as needed., Disp: 120 mL, Rfl: 0   hydrOXYzine  (VISTARIL ) 25 MG capsule, TAKE ONE CAPSULE BY MOUTH THREE TIMES A DAY AS NEEDED, Disp: 30 capsule, Rfl: 1   naproxen  (NAPROSYN ) 500 MG tablet, Take 1 tablet (500 mg total) by mouth 2 (two) times daily with a meal., Disp: 30 tablet, Rfl: 0   oseltamivir  (TAMIFLU ) 75 MG capsule, Take 1 capsule (75 mg total) by mouth 2 (two) times daily., Disp: 10 capsule, Rfl: 0   acetaminophen  (TYLENOL ) 500 MG tablet, Take 1,000 mg by mouth every 6 (six) hours as needed for moderate pain., Disp: , Rfl:    albuterol  (VENTOLIN  HFA) 108 (90 Base) MCG/ACT inhaler, Inhale 2 puffs into the lungs every 6 (six) hours as needed for wheezing or shortness of breath., Disp: 8 g, Rfl: 0   amphetamine -dextroamphetamine  (ADDERALL XR) 10 MG 24 hr capsule, Take 1 capsule (10 mg total) by mouth daily., Disp: 30 capsule, Rfl: 0   amphetamine -dextroamphetamine  (ADDERALL XR) 10 MG 24 hr capsule, Take 1 capsule (10 mg total) by mouth daily., Disp: 30 capsule, Rfl: 0   amphetamine -dextroamphetamine  (ADDERALL XR) 10 MG 24 hr capsule, Take 1 capsule (10 mg total) by mouth daily., Disp: 30 capsule, Rfl:  0   Cholecalciferol  1.25 MG (50000 UT) TABS, Take 1 tablet by mouth once a week., Disp: 12 tablet, Rfl: 0   FLUoxetine  (PROZAC ) 20 MG capsule, Take 1 capsule daily, 2 capsules during menses, Disp: 105 capsule, Rfl: 3   Iron , Ferrous Sulfate , 325 (65 Fe) MG TABS, Take 325 mg by mouth every other day., Disp: , Rfl:    JENCYCLA  0.35 MG tablet, TAKE ONE TABLET BY MOUTH DAILY FOR CONTINUOUS USE. START NEW PACK AFTER FINISHING PEVIOUS PACK, Disp: 112 tablet, Rfl: 1   predniSONE  (STERAPRED UNI-PAK 21 TAB) 10 MG (21) TBPK tablet, Take as directed, Disp: 1 each, Rfl: 0   rizatriptan  (MAXALT ) 10 MG tablet, Take 1 tablet (10 mg total) by mouth as needed for migraine. May repeat in 2 hours if needed, Disp: 10 tablet, Rfl: 2   topiramate  (TOPAMAX ) 100 MG tablet, Take 1 tablet (100 mg total) by mouth daily., Disp: 90 tablet, Rfl: 1   Medications ordered in this encounter:  Meds ordered this encounter  Medications   oseltamivir  (TAMIFLU ) 75 MG capsule    Sig: Take 1 capsule (75 mg total) by mouth 2 (two) times daily.    Dispense:  10 capsule    Refill:  0    Supervising Provider:   LAMPTEY, PHILIP O [[id number]]   brompheniramine-pseudoephedrine-DM 30-2-10 MG/5ML syrup    Sig: Take 5 mLs by mouth 4 (four) times daily as needed.    Dispense:  120 mL    Refill:  0  Supervising Provider:   LAMPTEY, PHILIP O [[id number]]   naproxen  (NAPROSYN ) 500 MG tablet    Sig: Take 1 tablet (500 mg total) by mouth 2 (two) times daily with a meal.    Dispense:  30 tablet    Refill:  0    Supervising Provider:   BLAISE ALEENE KIDD [phone]     *If you need refills on other medications prior to your next appointment, please contact your pharmacy*  Follow-Up: Call back or seek an in-person evaluation if the symptoms worsen or if the condition fails to improve as anticipated.  [facility] [phone]  Other Instructions Influenza, Adult Influenza is also called the flu. It's an infection that affects  your respiratory tract. This includes your nose, throat, windpipe, and lungs. The flu is contagious. This means it spreads easily from person to person. It causes symptoms that are like a cold. It can also cause a high fever and body aches. What are the causes? The flu is caused by the influenza virus. You can get it by: Breathing in droplets that are in the air after an infected person coughs or sneezes. Touching something that has the virus on it and then touching your mouth, nose, or eyes. What increases the risk? You may be more likely to get the flu if: You don't wash your hands often. You're near a lot of people during cold and flu season. You touch your mouth, eyes, or nose without washing your hands first. You don't get a flu shot each year. You may also be more at risk for the flu and serious problems, such as a lung infection called pneumonia, if: You're older than 65. You're pregnant. Your immune system is weak. Your immune system is your body's defense system. You have a long-term, or chronic, condition, such as: Heart, kidney, or lung disease. Diabetes. A liver disorder. Asthma. You're very overweight. You have anemia. This is when you don't have enough red blood cells in your body. What are the signs or symptoms? Flu symptoms often start all of a sudden. They may last 4-14 days and include: Fever and chills. Headaches, body aches, or muscle aches. Sore throat. Cough. Runny or stuffy nose. Discomfort in your chest. Not wanting to eat as much as normal. Feeling weak or tired. Feeling dizzy. Nausea or vomiting. How is this diagnosed? The flu may be diagnosed based on your symptoms and medical history. You may also have a physical exam. A swab may be taken from your nose or throat and tested for the virus. How is this treated? If the flu is found early, you can be treated with antiviral medicine. This may be given to you by mouth or through an IV. It can help you feel  less sick and get better faster. Taking care of yourself at home can also help your symptoms get better. Your health care provider may tell you to: Take over-the-counter medicines. Drink lots of fluids. The flu often goes away on its own. If you have very bad symptoms or problems caused by the flu, you may need to be treated in a hospital. Follow these instructions at home: Activity Rest as needed. Get lots of sleep. Stay home from work or school as told by your provider. Leave home only to go see your provider. Do not leave home for other reasons until you don't have a fever for 24 hours without taking medicine. Eating and drinking Take an oral rehydration solution (ORS). This is a  drink that is sold at pharmacies and stores. Drink enough fluid to keep your pee pale yellow. Try to drink small amounts of clear fluids. These include water, ice chips, fruit juice mixed with water, and low-calorie sports drinks. Try to eat bland foods that are easy to digest. These include bananas, applesauce, rice, lean meats, toast, and crackers. Avoid drinks that have a lot of sugar or caffeine in them. These include energy drinks, regular sports drinks, and soda. Do not drink alcohol. Do not eat spicy or fatty foods. General instructions     Take your medicines only as told by your provider. Use a cool mist humidifier to add moisture to the air in your home. This can make it easier for you to breathe. You should also clean the humidifier every day. To do so: Empty the water. Pour clean water in. Cover your mouth and nose when you cough or sneeze. Wash your hands with soap and water often and for at least 20 seconds. It's extra important to do so after you cough or sneeze. If you can't use soap and water, use hand sanitizer. How is this prevented?  Get a flu shot every year. Ask your provider when you should get your flu shot. Stay away from people who are sick during fall and winter. Fall and winter  are cold and flu season. Contact a health care provider if: You get new symptoms. You have chest pain. You have watery poop, also called diarrhea. You have a fever. Your cough gets worse. You start to have more mucus. You feel like you may vomit, or you vomit. Get help right away if: You become short of breath or have trouble breathing. Your skin or nails turn blue. You have very bad pain or stiffness in your neck. You get a sudden headache or pain in your face or ear. You vomit each time you eat or drink. These symptoms may be an emergency. Call 911 right away. Do not wait to see if the symptoms will go away. Do not drive yourself to the hospital. This information is not intended to replace advice given to you by your health care provider. Make sure you discuss any questions you have with your health care provider. Document Revised: 12/01/2022 Document Reviewed: 04/07/2022 Elsevier Patient Education  2024 [employer]   If you have been instructed to have an in-person evaluation today at a local Urgent Care facility, please use the link below. It will take you to a list of all of our available [facility], including address, phone number and hours of operation. Please do not delay care.  [facility]  If you or a family member do not have a primary care provider, use the link below to schedule a visit and establish care. When you choose a [facility] primary care physician or advanced practice provider, you gain a long-term partner in health. Find a Primary Care Provider  Learn more about [facility] in-office and virtual care options: [facility] - Get Care Now "

## 2024-03-08 ENCOUNTER — Encounter: Payer: Self-pay | Admitting: Physician Assistant

## 2024-03-29 ENCOUNTER — Ambulatory Visit: Admitting: Family

## 2024-03-29 ENCOUNTER — Ambulatory Visit: Payer: PRIVATE HEALTH INSURANCE | Admitting: Urology

## 2024-04-02 ENCOUNTER — Ambulatory Visit: Admitting: Family

## 2024-04-02 ENCOUNTER — Ambulatory Visit (INDEPENDENT_AMBULATORY_CARE_PROVIDER_SITE_OTHER)
Admission: RE | Admit: 2024-04-02 | Discharge: 2024-04-02 | Disposition: A | Discharge status: Home / Self Care | Payer: PRIVATE HEALTH INSURANCE | Source: Ambulatory Visit | Attending: Family | Admitting: Family

## 2024-04-02 ENCOUNTER — Encounter: Payer: Self-pay | Admitting: Family

## 2024-04-02 VITALS — BP 110/74 | HR 86 | Temp 98.2°F | Ht 62.0 in | Wt 159.2 lb

## 2024-04-02 DIAGNOSIS — E611 Iron deficiency: Secondary | ICD-10-CM

## 2024-04-02 DIAGNOSIS — E538 Deficiency of other specified B group vitamins: Secondary | ICD-10-CM | POA: Diagnosis not present

## 2024-04-02 DIAGNOSIS — M25512 Pain in left shoulder: Secondary | ICD-10-CM

## 2024-04-02 DIAGNOSIS — D500 Iron deficiency anemia secondary to blood loss (chronic): Secondary | ICD-10-CM | POA: Diagnosis not present

## 2024-04-02 DIAGNOSIS — M7582 Other shoulder lesions, left shoulder: Secondary | ICD-10-CM | POA: Diagnosis not present

## 2024-04-02 DIAGNOSIS — R41840 Attention and concentration deficit: Secondary | ICD-10-CM

## 2024-04-02 DIAGNOSIS — E559 Vitamin D deficiency, unspecified: Secondary | ICD-10-CM

## 2024-04-02 DIAGNOSIS — E7800 Pure hypercholesterolemia, unspecified: Secondary | ICD-10-CM

## 2024-04-02 LAB — CBC
HCT: 41.5 % (ref 36.0–46.0)
Hemoglobin: 13.5 g/dL (ref 12.0–15.0)
MCHC: 32.5 g/dL (ref 30.0–36.0)
MCV: 84.3 fl (ref 78.0–100.0)
Platelets: 254 K/uL (ref 150.0–400.0)
RBC: 4.92 Mil/uL (ref 3.87–5.11)
RDW: 14.9 % (ref 11.5–15.5)
WBC: 5.4 K/uL (ref 4.0–10.5)

## 2024-04-02 LAB — LIPID PANEL
Cholesterol: 202 mg/dL — ABNORMAL HIGH (ref 28–200)
HDL: 38.6 mg/dL — ABNORMAL LOW
LDL Cholesterol: 150 mg/dL — ABNORMAL HIGH (ref 10–99)
NonHDL: 163.78
Total CHOL/HDL Ratio: 5
Triglycerides: 69 mg/dL (ref 10.0–149.0)
VLDL: 13.8 mg/dL (ref 0.0–40.0)

## 2024-04-02 LAB — IBC + FERRITIN
Ferritin: 19 ng/mL (ref 10.0–291.0)
Iron: 99 ug/dL (ref 42–145)
Saturation Ratios: 29.5 % (ref 20.0–50.0)
TIBC: 336 ug/dL (ref 250.0–450.0)
Transferrin: 240 mg/dL (ref 212.0–360.0)

## 2024-04-02 LAB — B12 AND FOLATE PANEL
Folate: 6.3 ng/mL
Vitamin B-12: >1500 pg/mL — ABNORMAL HIGH (ref 211–911)

## 2024-04-02 LAB — VITAMIN D 25 HYDROXY (VIT D DEFICIENCY, FRACTURES): VITD: 47.6 ng/mL (ref 30.00–100.00)

## 2024-04-02 MED ORDER — AMPHETAMINE-DEXTROAMPHET ER 10 MG PO CP24: 10.0000 mg | ORAL_CAPSULE | Freq: Every day | ORAL | 0 refills | Status: AC

## 2024-04-02 NOTE — Progress Notes (Signed)
 "  Established Patient Office Visit  Subjective:      CC:  Chief Complaint  Patient presents with   Medical Management of Chronic Issues    HPI: Cassie English is a 44 y.o. female presenting on 04/02/2024 for Medical Management of Chronic Issues .  Discussed the use of AI scribe software for clinical note transcription with the patient, who gave verbal consent to proceed.  History of Present Illness Cassie English is a 44 year old female who presents with shoulder pain.  She has been experiencing shoulder pain for the past month, localized to the joint without radiation. The pain is severe, especially at night, and she has difficulty lifting her arm above her head or reaching behind her back. There is no history of recent trauma or unusual activity that could have caused the pain. She has tried Tylenol  and heat application with minimal relief.  She had the flu over the Christmas period, which has since improved, though she still has a lingering cough. Many colleagues at her workplace were also affected by the flu, but she received medication early and has mostly recovered.  She is currently taking several supplements including B12, iron , astaxanthin, vitamin C, and peptides. She started taking over-the-counter B12 at a dose of 5000 micrograms after noticing low levels in her labs. She is also taking Adderall 10 mg, which she finds effective for her needs, using it primarily on workdays.  No chest pain or palpitations associated with Adderall use. She also denies any muscle pain or spasms related to her shoulder issue.         Social history:  Relevant past medical, surgical, family and social history reviewed and updated as indicated. Interim medical history since our last visit reviewed.  Allergies and medications reviewed and updated.  DATA REVIEWED: CHART IN EPIC     ROS: Negative unless specifically indicated above in HPI.   Current Medications[1]         Objective:        BP 110/74 (BP Location: Left Arm, Patient Position: Sitting, Cuff Size: Normal)   Pulse 86   Temp 98.2 F (36.8 C) (Temporal)   Ht 5' 2 (1.575 m)   Wt 159 lb 3.2 oz (72.2 kg)   SpO2 96%   BMI 29.12 kg/m   Physical Exam CARDIOVASCULAR: Heart sounds normal. MUSCULOSKELETAL: Shoulder strength 5/5.  Wt Readings from Last 3 Encounters:  04/02/24 159 lb 3.2 oz (72.2 kg)  11/29/23 165 lb 6.4 oz (75 kg)  09/27/23 156 lb (70.8 kg)    Physical Exam Vitals reviewed.  Constitutional:      General: She is not in acute distress.    Appearance: Normal appearance. She is normal weight. She is not ill-appearing, toxic-appearing or diaphoretic.  HENT:     Head: Normocephalic.  Cardiovascular:     Rate and Rhythm: Normal rate.  Pulmonary:     Effort: Pulmonary effort is normal.  Musculoskeletal:     Left shoulder: Tenderness present. No bony tenderness. Decreased range of motion. Normal strength.  Neurological:     General: No focal deficit present.     Mental Status: She is alert and oriented to person, place, and time. Mental status is at baseline.  Psychiatric:        Mood and Affect: Mood normal.        Behavior: Behavior normal.        Thought Content: Thought content normal.  Judgment: Judgment normal.          Results   Assessment & Plan:   Assessment and Plan Assessment & Plan Left rotator cuff tendinitis with acute shoulder pain Acute left shoulder pain localized to the rotator cuff area, persisting for one month. Pain is exacerbated by lifting, reaching behind the back, and lying on the affected side, particularly at night. No history of trauma or unusual activity. Differential includes rotator cuff tendinitis, likely due to overuse or repetitive movements. No signs of a tear as she can still lift and lower the arm. - Ordered shoulder x-ray to rule out other causes. - Advised use of ice and heat application. - Recommended  anti-inflammatory medications such as Voltaren gel or Tylenol . - Encouraged regular movement exercises, including wall climbs and pendulum exercises. - Advised against immobilization but to avoid activities that exacerbate pain. - Will consider physical therapy if symptoms persist.  Vitamin B12 deficiency Currently taking oral B12 supplements (5000 mcg) and plans to reassess levels with labs before considering B12 injections. Oral supplementation is effective if absorption is adequate. - Ordered lab tests to assess B12 levels.  Vitamin D  deficiency - Ordered lab tests to assess vitamin D  levels.  Iron  deficiency Currently taking iron  supplements. - Ordered lab tests to assess iron  levels.  Pure hypercholesterolemia Prefers to avoid statins due to concerns about side effects, including potential links to Alzheimer's and dementia. Recent studies suggest statins do not increase Alzheimer's risk. Interested in alternative treatments such as peptides and supplements. - Ordered lab tests to assess cholesterol levels.  Attention-deficit hyperactivity disorder (ADHD) Currently managed with Adderall XR 10 mg daily, which is effective. Takes medication before work and not on weekends. - Continue current Adderall XR 10 mg regimen.        Return in about 6 months (around 09/30/2024) for f/u CPE.     Ginger Patrick, MSN, APRN, FNP-C [facility]        [1]  Current Outpatient Medications:    acetaminophen  (TYLENOL ) 500 MG tablet, Take 1,000 mg by mouth every 6 (six) hours as needed for moderate pain., Disp: , Rfl:    brompheniramine-pseudoephedrine-DM 30-2-10 MG/5ML syrup, Take 5 mLs by mouth 4 (four) times daily as needed., Disp: 120 mL, Rfl: 0   Cholecalciferol  1.25 MG (50000 UT) TABS, Take 1 tablet by mouth once a week., Disp: 12 tablet, Rfl: 0   FLUoxetine  (PROZAC ) 20 MG capsule, Take 1 capsule daily, 2 capsules during menses, Disp: 105 capsule, Rfl: 3    hydrOXYzine  (VISTARIL ) 25 MG capsule, TAKE ONE CAPSULE BY MOUTH THREE TIMES A DAY AS NEEDED, Disp: 30 capsule, Rfl: 1   Iron , Ferrous Sulfate , 325 (65 Fe) MG TABS, Take 325 mg by mouth every other day., Disp: , Rfl:    JENCYCLA  0.35 MG tablet, TAKE ONE TABLET BY MOUTH DAILY FOR CONTINUOUS USE. START NEW PACK AFTER FINISHING PEVIOUS PACK, Disp: 112 tablet, Rfl: 1   rizatriptan  (MAXALT ) 10 MG tablet, Take 1 tablet (10 mg total) by mouth as needed for migraine. May repeat in 2 hours if needed, Disp: 10 tablet, Rfl: 2   topiramate  (TOPAMAX ) 100 MG tablet, Take 1 tablet (100 mg total) by mouth daily., Disp: 90 tablet, Rfl: 1   [START ON 04/23/2024] amphetamine -dextroamphetamine  (ADDERALL XR) 10 MG 24 hr capsule, Take 1 capsule (10 mg total) by mouth daily., Disp: 30 capsule, Rfl: 0   amphetamine -dextroamphetamine  (ADDERALL XR) 10 MG 24 hr capsule, Take 1 capsule (10 mg  total) by mouth daily., Disp: 30 capsule, Rfl: 0   [START ON 05/14/2024] amphetamine -dextroamphetamine  (ADDERALL XR) 10 MG 24 hr capsule, Take 1 capsule (10 mg total) by mouth daily., Disp: 30 capsule, Rfl: 0  "

## 2024-04-04 ENCOUNTER — Ambulatory Visit: Payer: Self-pay | Admitting: Family

## 2024-04-04 DIAGNOSIS — M7582 Other shoulder lesions, left shoulder: Secondary | ICD-10-CM

## 2024-04-04 DIAGNOSIS — M25512 Pain in left shoulder: Secondary | ICD-10-CM

## 2024-04-17 ENCOUNTER — Ambulatory Visit: Payer: PRIVATE HEALTH INSURANCE | Admitting: Urology

## 2024-05-15 ENCOUNTER — Ambulatory Visit: Payer: PRIVATE HEALTH INSURANCE | Admitting: Urology

## 2024-10-01 ENCOUNTER — Encounter: Payer: PRIVATE HEALTH INSURANCE | Admitting: Family
